# Patient Record
Sex: Female | Born: 1955 | Race: Black or African American | Hispanic: No | Smoking: Current every day smoker
Health system: Southern US, Community
[De-identification: ages and names within clinical notes are randomized; demographics above are authoritative.]

## PROBLEM LIST (undated history)

## (undated) DIAGNOSIS — M1990 Unspecified osteoarthritis, unspecified site: Secondary | ICD-10-CM

## (undated) DIAGNOSIS — E785 Hyperlipidemia, unspecified: Secondary | ICD-10-CM

## (undated) DIAGNOSIS — I10 Essential (primary) hypertension: Secondary | ICD-10-CM

## (undated) HISTORY — DX: Essential (primary) hypertension: I10

## (undated) HISTORY — PX: TUBAL LIGATION: SHX77

## (undated) HISTORY — PX: CHOLECYSTECTOMY: SHX55

## (undated) HISTORY — DX: Hyperlipidemia, unspecified: E78.5

## (undated) HISTORY — PX: HERNIA REPAIR: SHX51

---

## 2001-02-12 ENCOUNTER — Encounter: Payer: Self-pay | Admitting: Obstetrics and Gynecology

## 2001-02-12 ENCOUNTER — Ambulatory Visit (HOSPITAL_COMMUNITY): Admission: RE | Admit: 2001-02-12 | Discharge: 2001-02-12 | Payer: Self-pay | Admitting: Obstetrics and Gynecology

## 2001-04-21 ENCOUNTER — Other Ambulatory Visit: Admission: RE | Admit: 2001-04-21 | Discharge: 2001-04-21 | Payer: Self-pay | Admitting: Obstetrics and Gynecology

## 2001-11-19 ENCOUNTER — Emergency Department (HOSPITAL_COMMUNITY): Admission: EM | Admit: 2001-11-19 | Discharge: 2001-11-19 | Payer: Self-pay | Admitting: *Deleted

## 2002-02-16 ENCOUNTER — Ambulatory Visit (HOSPITAL_COMMUNITY): Admission: RE | Admit: 2002-02-16 | Discharge: 2002-02-16 | Payer: Self-pay | Admitting: Obstetrics and Gynecology

## 2002-02-16 ENCOUNTER — Encounter: Payer: Self-pay | Admitting: Obstetrics and Gynecology

## 2003-03-14 ENCOUNTER — Encounter: Payer: Self-pay | Admitting: Obstetrics and Gynecology

## 2003-03-14 ENCOUNTER — Ambulatory Visit (HOSPITAL_COMMUNITY): Admission: RE | Admit: 2003-03-14 | Discharge: 2003-03-14 | Payer: Self-pay | Admitting: Obstetrics and Gynecology

## 2003-11-06 ENCOUNTER — Ambulatory Visit (HOSPITAL_COMMUNITY): Admission: RE | Admit: 2003-11-06 | Discharge: 2003-11-06 | Payer: Self-pay | Admitting: Family Medicine

## 2004-04-17 ENCOUNTER — Ambulatory Visit (HOSPITAL_COMMUNITY): Admission: RE | Admit: 2004-04-17 | Discharge: 2004-04-17 | Payer: Self-pay | Admitting: Obstetrics and Gynecology

## 2005-05-08 ENCOUNTER — Ambulatory Visit (HOSPITAL_COMMUNITY): Admission: RE | Admit: 2005-05-08 | Discharge: 2005-05-08 | Payer: Self-pay | Admitting: Obstetrics and Gynecology

## 2006-05-18 ENCOUNTER — Ambulatory Visit (HOSPITAL_COMMUNITY): Admission: RE | Admit: 2006-05-18 | Discharge: 2006-05-18 | Payer: Self-pay | Admitting: Obstetrics & Gynecology

## 2006-09-10 ENCOUNTER — Ambulatory Visit (HOSPITAL_COMMUNITY): Admission: RE | Admit: 2006-09-10 | Discharge: 2006-09-10 | Payer: Self-pay | Admitting: Family Medicine

## 2007-05-20 ENCOUNTER — Ambulatory Visit (HOSPITAL_COMMUNITY): Admission: RE | Admit: 2007-05-20 | Discharge: 2007-05-20 | Payer: Self-pay | Admitting: Obstetrics & Gynecology

## 2007-05-24 ENCOUNTER — Other Ambulatory Visit: Admission: RE | Admit: 2007-05-24 | Discharge: 2007-05-24 | Payer: Self-pay | Admitting: Obstetrics & Gynecology

## 2008-02-26 ENCOUNTER — Ambulatory Visit (HOSPITAL_COMMUNITY): Admission: RE | Admit: 2008-02-26 | Discharge: 2008-02-26 | Payer: Self-pay | Admitting: Cardiology

## 2008-02-28 ENCOUNTER — Observation Stay (HOSPITAL_COMMUNITY): Admission: EM | Admit: 2008-02-28 | Discharge: 2008-02-29 | Payer: Self-pay | Admitting: Emergency Medicine

## 2008-02-28 ENCOUNTER — Encounter (INDEPENDENT_AMBULATORY_CARE_PROVIDER_SITE_OTHER): Payer: Self-pay | Admitting: General Surgery

## 2008-04-21 ENCOUNTER — Ambulatory Visit (HOSPITAL_COMMUNITY): Admission: RE | Admit: 2008-04-21 | Discharge: 2008-04-21 | Payer: Self-pay | Admitting: General Surgery

## 2008-05-26 ENCOUNTER — Other Ambulatory Visit: Admission: RE | Admit: 2008-05-26 | Discharge: 2008-05-26 | Payer: Self-pay | Admitting: Obstetrics and Gynecology

## 2008-05-26 ENCOUNTER — Ambulatory Visit (HOSPITAL_COMMUNITY): Admission: RE | Admit: 2008-05-26 | Discharge: 2008-05-26 | Payer: Self-pay | Admitting: Obstetrics & Gynecology

## 2009-06-25 ENCOUNTER — Ambulatory Visit (HOSPITAL_COMMUNITY): Admission: RE | Admit: 2009-06-25 | Discharge: 2009-06-25 | Payer: Self-pay | Admitting: Obstetrics & Gynecology

## 2009-08-07 ENCOUNTER — Other Ambulatory Visit: Admission: RE | Admit: 2009-08-07 | Discharge: 2009-08-07 | Payer: Self-pay | Admitting: Obstetrics & Gynecology

## 2010-11-19 NOTE — H&P (Signed)
[name], [name]              ACCOUNT NO.:  [id number]   MEDICAL RECORD NO.:  [id number]          PATIENT TYPE:  AMB   LOCATION:  DAY                           FACILITY:  [facility]   PHYSICIAN:  Dalia Heading, M.D.  DATE OF BIRTH:  12-18-1955   DATE OF ADMISSION:  DATE OF DISCHARGE:  LH                              HISTORY & PHYSICAL   CHIEF COMPLAINT:  Need for screening colonoscopy.   HISTORY OF PRESENT ILLNESS:  The patient is a 55 year old black female  who is referred for endoscopic evaluation.  She needs colonoscopy for  screening purposes.  No abdominal pain, weight loss, nausea, vomiting,  diarrhea, constipation, melena, or hematochezia had been noted.  She has  never had a colonoscopy.  There is no family history of colon carcinoma.   PAST MEDICAL HISTORY:  Hypertension, hypercholesterolemia.   PAST SURGICAL HISTORY:  Laparoscopic cholecystectomy in August 2009,  tubal ligation.   CURRENT MEDICATIONS:  A blood pressure pill.   ALLERGIES:  No known drug allergies.   REVIEW OF SYSTEMS:  Noncontributory.   PHYSICAL EXAMINATION:  GENERAL:  The patient is a well-developed, well-  nourished black female in no acute distress.  LUNGS:  Clear to auscultation with equal breath sounds bilaterally.  HEART:  Regular rate and rhythm without S3, S4, or murmurs.  ABDOMEN:  Soft, nontender, nondistended.  No hepatosplenomegaly or  masses are noted.  RECTAL:  Deferred to the procedure.   IMPRESSION:  Need for screening colonoscopy.   PLAN:  The patient is scheduled for a colonoscopy on April 21, 2008.  The risks and benefits of the procedure including bleeding and  perforation were fully explained to the patient, gave informed consent.      Dalia Heading, M.D.  Electronically Signed     MAJ/MEDQ  D:  04/04/2008  T:  04/05/2008  Job:  [id number]   cc:   Patrica Duel, M.D.  Fax: [phone]

## 2010-11-19 NOTE — Op Note (Signed)
[name], ABBASI              ACCOUNT NO.:  [id number]   MEDICAL RECORD NO.:  [id number]          PATIENT TYPE:  OBV   LOCATION:  A325                          FACILITY:  [facility]   PHYSICIAN:  Tilford Pillar, MD      DATE OF BIRTH:  [birth year]   DATE OF PROCEDURE:  02/28/2008  DATE OF DISCHARGE:  02/29/2008                               OPERATIVE REPORT   PREOPERATIVE DIAGNOSIS:  Cholelithiasis.   POSTOPERATIVE DIAGNOSIS:  Cholelithiasis.   PROCEDURES:  Laparoscopic cholecystectomy and enterolysis.   SURGEON:  Tilford Pillar, MD   ANESTHESIA:  General endotracheal, local anesthetic, 0.5% Sensorcaine  plain.   SPECIMEN:  Gallbladder.   ESTIMATED BLOOD LOSS:  Minimal   INDICATIONS:  The patient presented to [facility] with acute  right upper quadrant abdominal pain.  She had had similar episodes in  the past.  She was diagnosed with acute cholelithiasis.  The risks,  benefits, and alternatives of a laparoscopic possible open  cholecystectomy were discussed at length with the patient including, but  not limited to risk of bleeding, infection, bile leak, small bowel  injury, common bile duct injury, as well as the possibility of  intraoperative cardiac and pulmonary events.  The patient's questions  and concerns were addressed, and the patient was consented for the  planned procedure.   OPERATION:  The patient was taken to the operating room, was placed in a  supine position on the operating room table at which time the general  anesthetic was administered.  Once the patient was asleep, she was  endotracheally intubated by anesthesia.  At this point, her abdomen was  prepped with DuraPrep solution and draped in a sterile standard fashion.  A stab incision was created supraumbilically with an 11-blade scalpel.  Additional dissection down to the subcuticular tissue was carried out  using a Kocher clamp, which was utilized to grasp the anterior abdominal  wall fascia and  lifted anteriorly.  At this point, a Veress needle was  inserted.  Saline drop test was utilized to confirm intraperitoneal  placement and then pneumoperitoneum was initiated.  Once sufficient  pneumoperitoneum was obtained, an 11-mm trocar was inserted over a  laparoscope allowing visualization of the trocar entering into the  peritoneal cavity.  At this time, the inner cannula was removed.  The  laparoscope was reinserted.  There was no evidence of any trocar or  Veress needle placement injury.  At this time, the patient was placed in  a reverse Trendelenburg left lateral decubitus position and the  remaining trocars were placed in similar fashion with a stab incision  and placement of trocar under visualization.  An 11-mm trocar was placed  in the epigastrium, a 5-mm trocar was placed in the midline between the  two 11-mm trocars and a 5-mm trocar was placed in the right lateral  abdominal wall.  At this point, a grasper was utilized to grasp the  fundus of the gallbladder and lifted up and over the right lobe of the  liver.  There were several adhesions from the anterior abdominal wall to  the  liver capsule over the right lobe of the liver, these were taken  down using electrocautery to ensure that the elevation of the fundus of  the gallbladder did not cause the capsular tear.  At this point, the  peritoneum was bluntly stripped off of the infundibulum of the  gallbladder.  The cystic duct was identified.  A window was created  behind the cystic duct.  Three EndoClips were placed proximally, one  distally, and the cystic duct was divided between two most distal clips.  Similarly, the cystic artery was identified.  Two EndoClips placed  proximally, one distally, and cystic artery was divided between two more  cystic clips.  At this time, the electrocautery was utilized to dissect  the gallbladder free from the gallbladder fossa once the hemostasis was  obtained in the gallbladder fossa  using the electrocautery and at this  point once the gallbladder was freed, it was placed in an EndoCatch bag,  which was placed up and over the right lobe of the liver.  At this time,  attention was turned to closure.   A piece of Surgicel was placed in the gallbladder.  An Endoclose suture  passing device was utilized to pass a 2-0 Vicryl suture through both the  11-mm trocar sites.  With these sutures in place, the gallbladder was  removed in an intact EndoCatch bag to the epigastric trocar site.  Some  blunt dilatation was required to enlarge the trocar site adequately to  remove the gallbladder.  Once it was free, it was placed in the back  table and was sent as a permanent specimen to pathology.  The Vicryl  sutures were secured.  Local anesthetic was instilled.  A 4-0 Monocryl  was utilized to reapproximate the skin edges in a running subcuticular  suture.  The skin was washed and dried with moist dry towel.  Benzoin  was applied around incision.  Half-inch Steri-Strips were placed.  The  patient was allowed to come out of general anesthetic and was  transferred back to regular hospital bed and was transferred to  postanesthetic care unit in a stable condition.  At the conclusion of  procedure, all instrument, sponge, and needle counts were correct.  The  patient tolerated the procedure well.  l      Tilford Pillar, MD  Electronically Signed     BZ/MEDQ  D:  04/17/2008  T:  04/18/2008  Job:  [id number]   cc:   Dr. Phillips Odor

## 2010-11-19 NOTE — H&P (Signed)
[name], [name]              ACCOUNT NO.:  [id number]   MEDICAL RECORD NO.:  [id number]          PATIENT TYPE:  OBV   LOCATION:  A325                          FACILITY:  [facility]   PHYSICIAN:  Tilford Pillar, MD      DATE OF BIRTH:  1956/02/23   DATE OF ADMISSION:  02/28/2008  DATE OF DISCHARGE:  LH                              HISTORY & PHYSICAL   CHIEF COMPLAINT:  Abdominal pain in the right upper quadrant.   HISTORY OF PRESENT ILLNESS:  The patient is a 55 year old African  American female, who presented with a history of hypertension and  hypercholesterolemia, who presents with approximately 3 days of constant  and colicky right upper abdominal pain.  She has had similar  symptomatology in the past but never this severe and never lasted this  long.  She denies any radiation of symptomatologies.  She does state  that it gets worse with fatty greasy foods.  She has had no nausea and  vomiting.  No change in bowel movements.  No hematochezia.  No melena  and no acholic stools.  No history of jaundice.  No fevers and chills.  No relieving factors that she is aware of.  Her last meal that she ate  was last evening with some soup.  She does have a positive family  history for gallbladder disease.   PAST MEDICAL HISTORY:  1. Hypertension.  2. Hypercholesterolemia.   PAST SURGICAL HISTORY:  History of tubal ligation.   MEDICATION:  She is currently on antihypertensive medication, which she  cannot remember the name and a diuretic medication.   ALLERGIES:  No known drug allergies.   SOCIAL HISTORY:  She is a 1-pack-per day smoker.  Occasionally uses  alcohol.  No recreational drug use.   Review of systems is essentially unremarkable other than for above-  mentioned HPI.   PHYSICAL EXAMINATION:  VITAL SIGNS:  Afebrile.  Vital signs are stable  and nontachycardiac.  Blood pressure is normotensive and 98% oxygen  saturation on room air.  GENERAL:  The patient is a mildly obese  female.  She does not appear to  be in any acute distress.  She is alert and oriented x3.  HEENT:  Pupils are equal, round, and reactive.  Extraocular movements  are intact.  No conjunctival pallor or scleral icterus is noted.  Oral  mucosa is pink and moist.  Normal occlusion.  Trachea is midline.  NECK:  No cervical lymphadenopathy.  PULMONARY:  Unlabored respiration.  She is clear to auscultation  bilaterally.  CARDIOVASCULAR:  Regular rate and rhythm.  No murmurs or gallops are  apparent.  She has 2+ radial and dorsalis pedis pulses bilaterally.  No  peripheral edema is noted.  ABDOMEN:  Abdomen is obese and positive bowel sounds.  Abdomen is soft.  She does have a right upper quadrant pain, but no diffuse peritoneal  signs.  No rebound, no guarding, no masses, and no hernias.  EXTREMITIES:  Warm and dry.   PERTINENT LABORATORY AND RADIOGRAPHIC STUDIES:  CBC:  White blood cell  count 9.3, hemoglobin 14.2, hematocrit  43.3, and platelets of 296.  Basic Metabolic Panel:  Sodium 136, potassium 3.5, chloride 105, bicarb  25, BUN 9, creatinine 0.94, blood glucose 92, and total bili was within  normal limits.  AST and ALT were slightly elevated at 96 and 152  respectively.  Right upper quadrant ultrasound obtained on February 26, 2008, demonstrated positive gallbladder wall thickening and positive  gallstones consistent with acute cholecystitis.   ASSESSMENT/PLAN:  Acute cholelithiasis.  At this point, risks, benefits,  and alternative of laparoscopic possible open cholecystectomy were  discussed at length with the patient including but not limited to risk  of bleeding, infection, bile leak, small bowel injury, common bile duct  injury, as well as a possibility of intraoperative cardiac or pulmonary  events.  At this point, the patient will be kept in n.p.o. status, will  be given IV fluid hydration, and will be given intraoperative IV  antibiotic coverage.  A stat EKG and chest x-ray  will be obtained prior  to proceeding to the operating room for a urgent cholecystectomy.      Tilford Pillar, MD  Electronically Signed     BZ/MEDQ  D:  02/28/2008  T:  02/29/2008  Job:  [id number]   cc:   Patrica Duel, M.D.  Fax: [phone]

## 2012-08-20 ENCOUNTER — Other Ambulatory Visit (HOSPITAL_COMMUNITY): Payer: Self-pay | Admitting: Obstetrics and Gynecology

## 2012-08-20 DIAGNOSIS — Z139 Encounter for screening, unspecified: Secondary | ICD-10-CM

## 2012-08-27 ENCOUNTER — Ambulatory Visit (HOSPITAL_COMMUNITY): Payer: Self-pay

## 2012-09-03 ENCOUNTER — Ambulatory Visit (HOSPITAL_COMMUNITY)
Admission: RE | Admit: 2012-09-03 | Discharge: 2012-09-03 | Disposition: A | Discharge status: Home / Self Care | Payer: BC Managed Care – PPO | Source: Ambulatory Visit | Attending: Obstetrics and Gynecology | Admitting: Obstetrics and Gynecology

## 2012-09-03 DIAGNOSIS — Z139 Encounter for screening, unspecified: Secondary | ICD-10-CM

## 2012-09-03 DIAGNOSIS — Z1231 Encounter for screening mammogram for malignant neoplasm of breast: Secondary | ICD-10-CM | POA: Insufficient documentation

## 2012-09-06 ENCOUNTER — Other Ambulatory Visit: Payer: Self-pay | Admitting: Obstetrics and Gynecology

## 2012-10-13 ENCOUNTER — Encounter (HOSPITAL_COMMUNITY): Payer: BC Managed Care – PPO

## 2012-10-20 ENCOUNTER — Other Ambulatory Visit: Payer: Self-pay | Admitting: Obstetrics and Gynecology

## 2012-10-20 ENCOUNTER — Ambulatory Visit (HOSPITAL_COMMUNITY)
Admission: RE | Admit: 2012-10-20 | Discharge: 2012-10-20 | Disposition: A | Discharge status: Home / Self Care | Payer: BC Managed Care – PPO | Source: Ambulatory Visit | Attending: Obstetrics and Gynecology | Admitting: Obstetrics and Gynecology

## 2012-10-20 DIAGNOSIS — R928 Other abnormal and inconclusive findings on diagnostic imaging of breast: Secondary | ICD-10-CM

## 2012-10-26 ENCOUNTER — Ambulatory Visit (INDEPENDENT_AMBULATORY_CARE_PROVIDER_SITE_OTHER): Payer: Self-pay | Admitting: General Surgery

## 2013-05-31 ENCOUNTER — Telehealth (INDEPENDENT_AMBULATORY_CARE_PROVIDER_SITE_OTHER): Payer: Self-pay | Admitting: *Deleted

## 2013-05-31 ENCOUNTER — Ambulatory Visit (INDEPENDENT_AMBULATORY_CARE_PROVIDER_SITE_OTHER): Payer: BC Managed Care – PPO | Admitting: General Surgery

## 2013-05-31 ENCOUNTER — Encounter (INDEPENDENT_AMBULATORY_CARE_PROVIDER_SITE_OTHER): Payer: Self-pay | Admitting: General Surgery

## 2013-05-31 ENCOUNTER — Encounter (INDEPENDENT_AMBULATORY_CARE_PROVIDER_SITE_OTHER): Payer: Self-pay

## 2013-05-31 VITALS — BP 152/80 | HR 84 | Temp 98.1°F | Resp 16 | Ht 66.5 in | Wt 278.2 lb

## 2013-05-31 DIAGNOSIS — K439 Ventral hernia without obstruction or gangrene: Secondary | ICD-10-CM | POA: Insufficient documentation

## 2013-05-31 NOTE — Telephone Encounter (Signed)
I called pt and informed her of an appt for her CT scan at [facility] on 11/26 with an arrival time of 2:15pm.  Instructed her to drink 1st bottle of contrast at 12:30pm and 2nd bottle at 1:30pm.  Also informed her not to have any solid food 4 hours prior to scan.  Pt is agreeable with this plan.

## 2013-06-01 ENCOUNTER — Ambulatory Visit
Admission: RE | Admit: 2013-06-01 | Discharge: 2013-06-01 | Disposition: A | Discharge status: Home / Self Care | Payer: BC Managed Care – PPO | Source: Ambulatory Visit | Attending: General Surgery | Admitting: General Surgery

## 2013-06-01 DIAGNOSIS — K439 Ventral hernia without obstruction or gangrene: Secondary | ICD-10-CM

## 2013-06-01 MED ORDER — IOHEXOL 300 MG/ML  SOLN
125.0000 mL | Freq: Once | INTRAMUSCULAR | Status: AC | PRN
  Administered 2013-06-01: 125 mL via INTRAVENOUS

## 2013-06-01 NOTE — Progress Notes (Signed)
Patient ID: [name], female   DOB: [birth year], 56 y.o.   MRN: [id number]  Chief Complaint  Patient presents with  . New Evaluation    eval abd hernia    HPI [name] is a 57 y.o. female.  We are asked to see the patient in consultation by Dr. Phillips Odor to evaluate her for a ventral hernia. The patient is a 57 year old black female who has been experiencing crampy epigastric abdominal pain for the last year. She notices it mostly when she coughs. She denies any nausea or vomiting. Her appetite has been good about our working normally. She does have a history of a laparoscopic cholecystectomy and there maybe a 5 mm port Site overlying this area.  HPI  Past Medical History  Diagnosis Date  . Hyperlipidemia   . Hypertension     Past Surgical History  Procedure Laterality Date  . Cholecystectomy    . Tubal ligation      History reviewed. No pertinent family history.  Social History History  Substance Use Topics  . Smoking status: Current Every Day Smoker -- 1.00 packs/day    Types: Cigarettes  . Smokeless tobacco: Never Used  . Alcohol Use: Yes    No Known Allergies  Current Outpatient Prescriptions  Medication Sig Dispense Refill  . amLODipine-benazepril (LOTREL) 10-40 MG per capsule Take 1 capsule by mouth daily.      Marland Kitchen losartan-hydrochlorothiazide (HYZAAR) 100-25 MG per tablet Take 1 tablet by mouth daily.      . meloxicam (MOBIC) 15 MG tablet Take 15 mg by mouth daily.       No current facility-administered medications for this visit.    Review of Systems Review of Systems  Constitutional: Negative.   HENT: Negative.   Eyes: Negative.   Respiratory: Negative.   Cardiovascular: Negative.   Gastrointestinal: Negative.   Endocrine: Negative.   Genitourinary: Negative.   Musculoskeletal: Negative.   Skin: Negative.   Allergic/Immunologic: Negative.   Neurological: Negative.   Hematological: Negative.   Psychiatric/Behavioral: Negative.     Blood  pressure 152/80, pulse 84, temperature 98.1 F (36.7 C), temperature source Temporal, resp. rate 16, height 5' 6.5" (1.689 m), weight 278 lb 3.2 oz (126.191 kg).  Physical Exam Physical Exam  Constitutional: She is oriented to person, place, and time. She appears well-developed and well-nourished.  HENT:  Head: Normocephalic and atraumatic.  Eyes: Conjunctivae and EOM are normal. Pupils are equal, round, and reactive to light.  Neck: Normal range of motion. Neck supple.  Cardiovascular: Normal rate, regular rhythm and normal heart sounds.   Pulmonary/Chest: Effort normal and breath sounds normal.  Abdominal: Soft. Bowel sounds are normal.  There does appear to be some fullness midway between the xiphoid and umbilicus. This fullness does not seem to reduce. There is no abdominal distention. There is no tenderness.  Musculoskeletal: Normal range of motion.  Neurological: She is alert and oriented to person, place, and time.  Skin: Skin is warm and dry.  Psychiatric: She has a normal mood and affect. Her behavior is normal.    Data Reviewed As above  Assessment    The patient may have a hernia along her upper midline abdominal wall although with her size it is difficult to palpate completely. At this point I would recommend getting a CT scan of her abdomen and pelvis to confirm or rule out that she has a ventral hernia. This will also help for surgical planning she does have a hernia.  Plan    Plan for CT scan of the abdomen and pelvis. We will plan to see her back in the next week or so to go over the results of the study        TOTH III,PAUL S 06/01/2013, 1:15 PM

## 2013-06-07 ENCOUNTER — Telehealth (INDEPENDENT_AMBULATORY_CARE_PROVIDER_SITE_OTHER): Payer: Self-pay

## 2013-06-07 NOTE — Telephone Encounter (Signed)
Pt called for message and given the one left in Epic concerning her CT results.

## 2013-06-07 NOTE — Telephone Encounter (Signed)
Message copied by Brennan Bailey on Tue Jun 07, 2013  2:43 PM ------      Message from: Caleen Essex III      Created: Thu Jun 02, 2013 12:38 PM       She will need ventral hernia repair. I will talk to her about it when she comes back ------

## 2013-06-07 NOTE — Telephone Encounter (Signed)
LMOM> Ct shows ventral hernia. Will discuss surgical repair at follow up visit.

## 2013-06-16 ENCOUNTER — Ambulatory Visit (INDEPENDENT_AMBULATORY_CARE_PROVIDER_SITE_OTHER): Payer: BC Managed Care – PPO | Admitting: General Surgery

## 2013-06-16 ENCOUNTER — Encounter (HOSPITAL_COMMUNITY): Payer: Self-pay | Admitting: Pharmacy Technician

## 2013-06-16 ENCOUNTER — Encounter (INDEPENDENT_AMBULATORY_CARE_PROVIDER_SITE_OTHER): Payer: Self-pay | Admitting: General Surgery

## 2013-06-16 VITALS — BP 141/85 | HR 66 | Temp 97.7°F | Resp 18 | Ht 66.0 in | Wt 284.0 lb

## 2013-06-16 DIAGNOSIS — K439 Ventral hernia without obstruction or gangrene: Secondary | ICD-10-CM

## 2013-06-16 NOTE — Progress Notes (Signed)
Patient ID: [name], female   DOB: 12/23/1955, 57 y.o.   MRN: [id number]  Chief Complaint  Patient presents with  . Follow-up    talk about having surgery    HPI [name] is a 57 y.o. female.  The patient is a 57 -year-old black female who was so recently with some bulging of her upper abdominal wall especially when coughing. She underwent a CT scan which did show a small ventral supraumbilical hernia as well as a small umbilical hernia. There was no visceral contents within the hernia.  HPI  Past Medical History  Diagnosis Date  . Hyperlipidemia   . Hypertension     Past Surgical History  Procedure Laterality Date  . Cholecystectomy    . Tubal ligation      History reviewed. No pertinent family history.  Social History History  Substance Use Topics  . Smoking status: Current Every Day Smoker -- 1.00 packs/day    Types: Cigarettes  . Smokeless tobacco: Never Used  . Alcohol Use: Yes    No Known Allergies  Current Outpatient Prescriptions  Medication Sig Dispense Refill  . amLODipine-benazepril (LOTREL) 10-40 MG per capsule Take 1 capsule by mouth daily.      . losartan-hydrochlorothiazide (HYZAAR) 100-25 MG per tablet Take 1 tablet by mouth daily.      . meloxicam (MOBIC) 15 MG tablet Take 15 mg by mouth daily.       No current facility-administered medications for this visit.    Review of Systems Review of Systems  Constitutional: Negative.   HENT: Negative.   Eyes: Negative.   Respiratory: Negative.   Cardiovascular: Negative.   Gastrointestinal: Negative.   Endocrine: Negative.   Genitourinary: Negative.   Musculoskeletal: Negative.   Skin: Negative.   Allergic/Immunologic: Negative.   Neurological: Negative.   Hematological: Negative.   Psychiatric/Behavioral: Negative.     Blood pressure 141/85, pulse 66, temperature 97.7 F (36.5 C), temperature source Temporal, resp. rate 18, height 5' 6" (1.676 m), weight 284 lb (128.822  kg).  Physical Exam Physical Exam  Constitutional: She is oriented to person, place, and time. She appears well-developed and well-nourished.  HENT:  Head: Normocephalic and atraumatic.  Eyes: Conjunctivae and EOM are normal. Pupils are equal, round, and reactive to light.  Neck: Normal range of motion. Neck supple.  Cardiovascular: Normal rate, regular rhythm and normal heart sounds.   Pulmonary/Chest: Effort normal and breath sounds normal.  Abdominal: Soft. Bowel sounds are normal.  There is some slight fullness to the abdominal wall just above the umbilicus.  Musculoskeletal: Normal range of motion.  Lymphadenopathy:    She has no cervical adenopathy.  Neurological: She is alert and oriented to person, place, and time.  Skin: Skin is warm and dry.  Psychiatric: She has a normal mood and affect. Her behavior is normal.    Data Reviewed As above  Assessment    The patient has 2 small ventral hernias that are symptomatic. Because of the risk of incarceration And strangulation and the risk of these hernias getting larger I think she would benefit from having these hernias fixed. She would also like to have this done. I have discussed with her in detail the risks and benefits of the operation to fix the hernias as well as some of the technical aspects and she understands and wishes to proceed    Plan    Plan for laparoscopic ventral hernia repair with mesh          TOTH III,PAUL S 06/16/2013, 10:00 AM    

## 2013-06-16 NOTE — Patient Instructions (Signed)
Plan for laparoscopic ventral hernia repair with mesh 

## 2013-06-21 NOTE — Pre-Procedure Instructions (Signed)
Chrissi E Mclester  06/21/2013   Your procedure is scheduled on: Monday, Dec. 22th               (Free Valet Parking is available)  Report to [facility] (Entrance A) at 10:30 AM.   Call this number if you have problems the morning of surgery: [phone]   Remember:   Do not eat food or drink liquids after midnight Sunday.   Take these medicines the morning of surgery with A SIP OF WATER:  None   Do not wear jewelry, make-up or nail polish.  Do not wear lotions, powders, or perfumes. You may NOT wear deodorant.  Do not shave underarms & legs 48 hours prior to surgery.    Do not bring valuables to the hospital.  [facility] is not responsible for any belongings or valuables.               Contacts, dentures or bridgework may not be worn into surgery.  Leave suitcase in the car. After surgery it may be brought to your room.  For patients admitted to the hospital, discharge time is determined by your treatment team.               Patients discharged the day of surgery will not be allowed to drive home, and will need a responsible person to stay with you for 24 hrs afterwards.   Name and phone number of your driver:    Special Instructions: Shower using CHG 2 nights before surgery and the night before surgery.  If you shower the day of surgery use CHG.  Use special wash - you have one bottle of CHG for all showers.  You should use approximately 1/3 of the bottle for each shower.   Please read over the following fact sheets that you were given: Pain Booklet and Surgical Site Infection Prevention

## 2013-06-22 ENCOUNTER — Encounter (HOSPITAL_COMMUNITY)
Admission: RE | Admit: 2013-06-22 | Discharge: 2013-06-22 | Disposition: A | Discharge status: Home / Self Care | Payer: BC Managed Care – PPO | Source: Ambulatory Visit | Attending: General Surgery | Admitting: General Surgery

## 2013-06-22 ENCOUNTER — Encounter (HOSPITAL_COMMUNITY): Payer: Self-pay

## 2013-06-22 DIAGNOSIS — Z01810 Encounter for preprocedural cardiovascular examination: Secondary | ICD-10-CM | POA: Insufficient documentation

## 2013-06-22 DIAGNOSIS — Z01812 Encounter for preprocedural laboratory examination: Secondary | ICD-10-CM | POA: Insufficient documentation

## 2013-06-22 DIAGNOSIS — Z01818 Encounter for other preprocedural examination: Secondary | ICD-10-CM | POA: Insufficient documentation

## 2013-06-22 HISTORY — DX: Unspecified osteoarthritis, unspecified site: M19.90

## 2013-06-22 LAB — BASIC METABOLIC PANEL
BUN: 21 mg/dL (ref 6–23)
Calcium: 9.6 mg/dL (ref 8.4–10.5)
Creatinine, Ser: 1.1 mg/dL (ref 0.50–1.10)
GFR calc non Af Amer: 55 mL/min — ABNORMAL LOW (ref >90)
Glucose, Bld: 92 mg/dL (ref 70–99)
Potassium: 4 mEq/L (ref 3.5–5.1)
Sodium: 142 mEq/L (ref 135–145)

## 2013-06-22 LAB — CBC
Hemoglobin: 14.9 g/dL (ref 12.0–15.0)
MCH: 30.3 pg (ref 26.0–34.0)
MCHC: 34.5 g/dL (ref 30.0–36.0)
Platelets: 257 10*3/uL (ref 150–400)
RDW: 15.4 % (ref 11.5–15.5)
WBC: 10.7 10*3/uL — ABNORMAL HIGH (ref 4.0–10.5)

## 2013-06-22 MED ORDER — CHLORHEXIDINE GLUCONATE 4 % EX LIQD: 1.0000 "application " | Freq: Once | CUTANEOUS | Status: DC

## 2013-06-26 MED ORDER — DEXTROSE 5 % IV SOLN
3.0000 g | INTRAVENOUS | Status: AC
  Administered 2013-06-27: 3 g via INTRAVENOUS
  Filled 2013-06-26: qty 3000

## 2013-06-27 ENCOUNTER — Encounter (HOSPITAL_COMMUNITY)
Admission: RE | Disposition: A | Discharge status: Home / Self Care | Payer: Self-pay | Source: Ambulatory Visit | Attending: General Surgery

## 2013-06-27 ENCOUNTER — Encounter (HOSPITAL_COMMUNITY): Payer: BC Managed Care – PPO | Admitting: Anesthesiology

## 2013-06-27 ENCOUNTER — Encounter (HOSPITAL_COMMUNITY): Payer: Self-pay | Admitting: *Deleted

## 2013-06-27 ENCOUNTER — Ambulatory Visit (HOSPITAL_COMMUNITY)
Admission: RE | Admit: 2013-06-27 | Discharge: 2013-06-28 | Disposition: A | Discharge status: Home / Self Care | Payer: BC Managed Care – PPO | Source: Ambulatory Visit | Attending: General Surgery | Admitting: General Surgery

## 2013-06-27 ENCOUNTER — Ambulatory Visit (HOSPITAL_COMMUNITY): Payer: BC Managed Care – PPO | Admitting: Anesthesiology

## 2013-06-27 DIAGNOSIS — I10 Essential (primary) hypertension: Secondary | ICD-10-CM | POA: Diagnosis not present

## 2013-06-27 DIAGNOSIS — F17200 Nicotine dependence, unspecified, uncomplicated: Secondary | ICD-10-CM | POA: Diagnosis not present

## 2013-06-27 DIAGNOSIS — K429 Umbilical hernia without obstruction or gangrene: Secondary | ICD-10-CM | POA: Insufficient documentation

## 2013-06-27 DIAGNOSIS — K439 Ventral hernia without obstruction or gangrene: Secondary | ICD-10-CM | POA: Insufficient documentation

## 2013-06-27 DIAGNOSIS — E785 Hyperlipidemia, unspecified: Secondary | ICD-10-CM | POA: Diagnosis not present

## 2013-06-27 HISTORY — PX: INSERTION OF MESH: SHX5868

## 2013-06-27 HISTORY — PX: VENTRAL HERNIA REPAIR: SHX424

## 2013-06-27 SURGERY — REPAIR, HERNIA, VENTRAL, LAPAROSCOPIC
Anesthesia: General

## 2013-06-27 MED ORDER — EPHEDRINE SULFATE 50 MG/ML IJ SOLN
INTRAMUSCULAR | Status: DC | PRN
  Administered 2013-06-27: 5 mg via INTRAVENOUS

## 2013-06-27 MED ORDER — LACTATED RINGERS IV SOLN
INTRAVENOUS | Status: DC | PRN
  Administered 2013-06-27 (×2): via INTRAVENOUS

## 2013-06-27 MED ORDER — SUCCINYLCHOLINE CHLORIDE 20 MG/ML IJ SOLN
INTRAMUSCULAR | Status: DC | PRN
  Administered 2013-06-27: 100 mg via INTRAVENOUS

## 2013-06-27 MED ORDER — MORPHINE SULFATE 4 MG/ML IJ SOLN: 4.0000 mg | INTRAMUSCULAR | Status: DC | PRN

## 2013-06-27 MED ORDER — ROCURONIUM BROMIDE 100 MG/10ML IV SOLN
INTRAVENOUS | Status: DC | PRN
  Administered 2013-06-27: 50 mg via INTRAVENOUS
  Administered 2013-06-27: 10 mg via INTRAVENOUS

## 2013-06-27 MED ORDER — AMLODIPINE BESY-BENAZEPRIL HCL 10-40 MG PO CAPS: 1.0000 | ORAL_CAPSULE | Freq: Every day | ORAL | Status: DC

## 2013-06-27 MED ORDER — FENTANYL CITRATE 0.05 MG/ML IJ SOLN
INTRAMUSCULAR | Status: DC | PRN
  Administered 2013-06-27: 100 ug via INTRAVENOUS
  Administered 2013-06-27 (×3): 50 ug via INTRAVENOUS
  Administered 2013-06-27: 100 ug via INTRAVENOUS
  Administered 2013-06-27 (×3): 50 ug via INTRAVENOUS

## 2013-06-27 MED ORDER — HYDROCHLOROTHIAZIDE 25 MG PO TABS
25.0000 mg | ORAL_TABLET | Freq: Every day | ORAL | Status: DC
  Administered 2013-06-27: 25 mg via ORAL
  Filled 2013-06-27 (×2): qty 1

## 2013-06-27 MED ORDER — NEOSTIGMINE METHYLSULFATE 1 MG/ML IJ SOLN
INTRAMUSCULAR | Status: DC | PRN
  Administered 2013-06-27: 4 mg via INTRAVENOUS

## 2013-06-27 MED ORDER — HYDROMORPHONE HCL PF 1 MG/ML IJ SOLN
0.2500 mg | INTRAMUSCULAR | Status: DC | PRN
  Administered 2013-06-27 (×2): 0.5 mg via INTRAVENOUS

## 2013-06-27 MED ORDER — KETOROLAC TROMETHAMINE 30 MG/ML IJ SOLN
15.0000 mg | Freq: Once | INTRAMUSCULAR | Status: AC | PRN
  Administered 2013-06-27: 30 mg via INTRAVENOUS

## 2013-06-27 MED ORDER — LIDOCAINE HCL (CARDIAC) 20 MG/ML IV SOLN
INTRAVENOUS | Status: DC | PRN
  Administered 2013-06-27: 60 mg via INTRAVENOUS

## 2013-06-27 MED ORDER — BENAZEPRIL HCL 40 MG PO TABS
40.0000 mg | ORAL_TABLET | Freq: Every day | ORAL | Status: DC
  Administered 2013-06-27: 40 mg via ORAL
  Filled 2013-06-27 (×2): qty 1

## 2013-06-27 MED ORDER — 0.9 % SODIUM CHLORIDE (POUR BTL) OPTIME
TOPICAL | Status: DC | PRN
  Administered 2013-06-27: 1000 mL

## 2013-06-27 MED ORDER — OXYCODONE HCL 5 MG PO TABS
ORAL_TABLET | ORAL | Status: AC
  Filled 2013-06-27: qty 1

## 2013-06-27 MED ORDER — HYDROMORPHONE HCL PF 1 MG/ML IJ SOLN
INTRAMUSCULAR | Status: AC
  Filled 2013-06-27: qty 1

## 2013-06-27 MED ORDER — ONDANSETRON HCL 4 MG PO TABS: 4.0000 mg | ORAL_TABLET | Freq: Four times a day (QID) | ORAL | Status: DC | PRN

## 2013-06-27 MED ORDER — GLYCOPYRROLATE 0.2 MG/ML IJ SOLN
INTRAMUSCULAR | Status: DC | PRN
  Administered 2013-06-27: 0.6 mg via INTRAVENOUS

## 2013-06-27 MED ORDER — OXYCODONE HCL 5 MG/5ML PO SOLN: 5.0000 mg | Freq: Once | ORAL | Status: AC | PRN

## 2013-06-27 MED ORDER — BUPIVACAINE-EPINEPHRINE 0.25% -1:200000 IJ SOLN
INTRAMUSCULAR | Status: DC | PRN
  Administered 2013-06-27: 30 mL

## 2013-06-27 MED ORDER — ONDANSETRON HCL 4 MG/2ML IJ SOLN
INTRAMUSCULAR | Status: DC | PRN
  Administered 2013-06-27: 4 mg via INTRAVENOUS

## 2013-06-27 MED ORDER — AMLODIPINE BESYLATE 10 MG PO TABS
10.0000 mg | ORAL_TABLET | Freq: Every day | ORAL | Status: DC
  Administered 2013-06-27: 10 mg via ORAL
  Filled 2013-06-27 (×2): qty 1

## 2013-06-27 MED ORDER — KETOROLAC TROMETHAMINE 30 MG/ML IJ SOLN
INTRAMUSCULAR | Status: AC
  Filled 2013-06-27: qty 1

## 2013-06-27 MED ORDER — PHENYLEPHRINE HCL 10 MG/ML IJ SOLN
10.0000 mg | INTRAVENOUS | Status: DC | PRN
  Administered 2013-06-27: 25 ug/min via INTRAVENOUS

## 2013-06-27 MED ORDER — BUPIVACAINE-EPINEPHRINE PF 0.25-1:200000 % IJ SOLN
INTRAMUSCULAR | Status: AC
  Filled 2013-06-27: qty 30

## 2013-06-27 MED ORDER — OXYCODONE HCL 5 MG PO TABS
5.0000 mg | ORAL_TABLET | Freq: Once | ORAL | Status: AC | PRN
  Administered 2013-06-27: 5 mg via ORAL

## 2013-06-27 MED ORDER — MIDAZOLAM HCL 5 MG/5ML IJ SOLN
INTRAMUSCULAR | Status: DC | PRN
  Administered 2013-06-27: 2 mg via INTRAVENOUS

## 2013-06-27 MED ORDER — PROPOFOL 10 MG/ML IV BOLUS
INTRAVENOUS | Status: DC | PRN
  Administered 2013-06-27: 50 mg via INTRAVENOUS
  Administered 2013-06-27 (×2): 20 mg via INTRAVENOUS
  Administered 2013-06-27: 180 mg via INTRAVENOUS
  Administered 2013-06-27: 50 mg via INTRAVENOUS

## 2013-06-27 MED ORDER — ONDANSETRON HCL 4 MG/2ML IJ SOLN: 4.0000 mg | Freq: Once | INTRAMUSCULAR | Status: DC | PRN

## 2013-06-27 MED ORDER — OXYCODONE-ACETAMINOPHEN 5-325 MG PO TABS
1.0000 | ORAL_TABLET | ORAL | Status: DC | PRN
  Administered 2013-06-28 (×2): 1 via ORAL
  Filled 2013-06-27 (×2): qty 1

## 2013-06-27 MED ORDER — LOSARTAN POTASSIUM-HCTZ 100-25 MG PO TABS: 1.0000 | ORAL_TABLET | Freq: Every day | ORAL | Status: DC

## 2013-06-27 MED ORDER — LACTATED RINGERS IV SOLN
INTRAVENOUS | Status: DC
  Administered 2013-06-27: 11:00:00 via INTRAVENOUS

## 2013-06-27 MED ORDER — KCL IN DEXTROSE-NACL 20-5-0.9 MEQ/L-%-% IV SOLN
INTRAVENOUS | Status: DC
  Administered 2013-06-27 – 2013-06-28 (×2): via INTRAVENOUS
  Filled 2013-06-27 (×3): qty 1000

## 2013-06-27 MED ORDER — HYDROMORPHONE HCL PF 1 MG/ML IJ SOLN
INTRAMUSCULAR | Status: DC | PRN
Start: 2013-06-27 — End: 2013-06-27
  Administered 2013-06-27: 1 mg via INTRAVENOUS

## 2013-06-27 MED ORDER — ONDANSETRON HCL 4 MG/2ML IJ SOLN
4.0000 mg | Freq: Four times a day (QID) | INTRAMUSCULAR | Status: DC | PRN
  Administered 2013-06-27: 4 mg via INTRAVENOUS
  Filled 2013-06-27: qty 2

## 2013-06-27 MED ORDER — SODIUM CHLORIDE 0.9 % IR SOLN
Status: DC | PRN
  Administered 2013-06-27: 1000 mL

## 2013-06-27 SURGICAL SUPPLY — 57 items
ADH SKN CLS APL DERMABOND .7 (GAUZE/BANDAGES/DRESSINGS) ×4
APPLIER CLIP LOGIC TI 5 (MISCELLANEOUS) IMPLANT
APPLIER CLIP ROT 10 11.4 M/L (STAPLE)
APR CLP MED LRG 11.4X10 (STAPLE)
APR CLP MED LRG 33X5 (MISCELLANEOUS)
BANDAGE GAUZE ELAST BULKY 4 IN (GAUZE/BANDAGES/DRESSINGS) IMPLANT
BINDER ABD UNIV 12 30-45 (MISCELLANEOUS) IMPLANT
BINDER ABDOMINAL 12 (MISCELLANEOUS) ×2
CANISTER SUCTION 2500CC (MISCELLANEOUS) ×1 IMPLANT
CHLORAPREP W/TINT 26ML (MISCELLANEOUS) ×2 IMPLANT
CLIP APPLIE ROT 10 11.4 M/L (STAPLE) IMPLANT
COVER SURGICAL LIGHT HANDLE (MISCELLANEOUS) ×2 IMPLANT
DECANTER SPIKE VIAL GLASS SM (MISCELLANEOUS) ×2 IMPLANT
DERMABOND ADVANCED (GAUZE/BANDAGES/DRESSINGS) ×4
DERMABOND ADVANCED .7 DNX12 (GAUZE/BANDAGES/DRESSINGS) ×1 IMPLANT
DEVICE SECURE STRAP 25 ABSORB (INSTRUMENTS) ×2 IMPLANT
DEVICE TROCAR PUNCTURE CLOSURE (ENDOMECHANICALS) ×2 IMPLANT
DRAPE INCISE IOBAN 66X45 STRL (DRAPES) ×2 IMPLANT
DRAPE UTILITY 15X26 W/TAPE STR (DRAPE) ×4 IMPLANT
ELECT CAUTERY BLADE 6.4 (BLADE) ×2 IMPLANT
ELECT REM PT RETURN 9FT ADLT (ELECTROSURGICAL) ×2
ELECTRODE REM PT RTRN 9FT ADLT (ELECTROSURGICAL) ×1 IMPLANT
GLOVE BIO SURGEON STRL SZ7.5 (GLOVE) ×2 IMPLANT
GLOVE BIOGEL PI IND STRL 6.5 (GLOVE) IMPLANT
GLOVE BIOGEL PI IND STRL 7.0 (GLOVE) IMPLANT
GLOVE BIOGEL PI IND STRL 7.5 (GLOVE) IMPLANT
GLOVE BIOGEL PI INDICATOR 6.5 (GLOVE) ×2
GLOVE BIOGEL PI INDICATOR 7.0 (GLOVE) ×3
GLOVE BIOGEL PI INDICATOR 7.5 (GLOVE) ×1
GLOVE ECLIPSE 6.5 STRL STRAW (GLOVE) ×1 IMPLANT
GLOVE SURG SS PI 7.5 STRL IVOR (GLOVE) ×3 IMPLANT
GOWN STRL NON-REIN LRG LVL3 (GOWN DISPOSABLE) ×6 IMPLANT
GOWN STRL REIN XL XLG (GOWN DISPOSABLE) ×2 IMPLANT
KIT BASIN OR (CUSTOM PROCEDURE TRAY) ×2 IMPLANT
KIT ROOM TURNOVER OR (KITS) ×2 IMPLANT
MARKER SKIN DUAL TIP RULER LAB (MISCELLANEOUS) ×2 IMPLANT
MESH VENTRALIGHT ST 6X8 (Mesh Specialty) ×2 IMPLANT
MESH VENTRLGHT ELLIPSE 8X6XMFL (Mesh Specialty) IMPLANT
NDL SPNL 22GX3.5 QUINCKE BK (NEEDLE) ×1 IMPLANT
NEEDLE SPNL 22GX3.5 QUINCKE BK (NEEDLE) ×2 IMPLANT
NS IRRIG 1000ML POUR BTL (IV SOLUTION) ×2 IMPLANT
PAD ARMBOARD 7.5X6 YLW CONV (MISCELLANEOUS) ×2 IMPLANT
PENCIL BUTTON HOLSTER BLD 10FT (ELECTRODE) ×2 IMPLANT
SCALPEL HARMONIC ACE (MISCELLANEOUS) ×1 IMPLANT
SCISSORS LAP 5X35 DISP (ENDOMECHANICALS) ×1 IMPLANT
SET IRRIG TUBING LAPAROSCOPIC (IRRIGATION / IRRIGATOR) IMPLANT
SLEEVE ENDOPATH XCEL 5M (ENDOMECHANICALS) ×3 IMPLANT
SUT MNCRL AB 4-0 PS2 18 (SUTURE) ×2 IMPLANT
SUT NOVA NAB DX-16 0-1 5-0 T12 (SUTURE) ×3 IMPLANT
TOWEL OR 17X24 6PK STRL BLUE (TOWEL DISPOSABLE) ×2 IMPLANT
TOWEL OR 17X26 10 PK STRL BLUE (TOWEL DISPOSABLE) ×2 IMPLANT
TRAY FOLEY CATH 16FR SILVER (SET/KITS/TRAYS/PACK) ×2 IMPLANT
TRAY LAPAROSCOPIC (CUSTOM PROCEDURE TRAY) ×2 IMPLANT
TROCAR XCEL BLUNT TIP 100MML (ENDOMECHANICALS) IMPLANT
TROCAR XCEL NON-BLD 11X100MML (ENDOMECHANICALS) ×2 IMPLANT
TROCAR XCEL NON-BLD 5MMX100MML (ENDOMECHANICALS) ×2 IMPLANT
TUBING INSUFFLATION (TUBING) ×1 IMPLANT

## 2013-06-27 NOTE — H&P (View-Only) (Signed)
Patient ID: [name], female   DOB: 05/08/1956, 57 y.o.   MRN: [id number]  Chief Complaint  Patient presents with  . Follow-up    talk about having surgery    HPI [name] is a 57 y.o. female.  The patient is a 77 -year-old black female who was so recently with some bulging of her upper abdominal wall especially when coughing. She underwent a CT scan which did show a small ventral supraumbilical hernia as well as a small umbilical hernia. There was no visceral contents within the hernia.  HPI  Past Medical History  Diagnosis Date  . Hyperlipidemia   . Hypertension     Past Surgical History  Procedure Laterality Date  . Cholecystectomy    . Tubal ligation      History reviewed. No pertinent family history.  Social History History  Substance Use Topics  . Smoking status: Current Every Day Smoker -- 1.00 packs/day    Types: Cigarettes  . Smokeless tobacco: Never Used  . Alcohol Use: Yes    No Known Allergies  Current Outpatient Prescriptions  Medication Sig Dispense Refill  . amLODipine-benazepril (LOTREL) 10-40 MG per capsule Take 1 capsule by mouth daily.      Marland Kitchen losartan-hydrochlorothiazide (HYZAAR) 100-25 MG per tablet Take 1 tablet by mouth daily.      . meloxicam (MOBIC) 15 MG tablet Take 15 mg by mouth daily.       No current facility-administered medications for this visit.    Review of Systems Review of Systems  Constitutional: Negative.   HENT: Negative.   Eyes: Negative.   Respiratory: Negative.   Cardiovascular: Negative.   Gastrointestinal: Negative.   Endocrine: Negative.   Genitourinary: Negative.   Musculoskeletal: Negative.   Skin: Negative.   Allergic/Immunologic: Negative.   Neurological: Negative.   Hematological: Negative.   Psychiatric/Behavioral: Negative.     Blood pressure 141/85, pulse 66, temperature 97.7 F (36.5 C), temperature source Temporal, resp. rate 18, height 5\' 6"  (1.676 m), weight 284 lb (128.822  kg).  Physical Exam Physical Exam  Constitutional: She is oriented to person, place, and time. She appears well-developed and well-nourished.  HENT:  Head: Normocephalic and atraumatic.  Eyes: Conjunctivae and EOM are normal. Pupils are equal, round, and reactive to light.  Neck: Normal range of motion. Neck supple.  Cardiovascular: Normal rate, regular rhythm and normal heart sounds.   Pulmonary/Chest: Effort normal and breath sounds normal.  Abdominal: Soft. Bowel sounds are normal.  There is some slight fullness to the abdominal wall just above the umbilicus.  Musculoskeletal: Normal range of motion.  Lymphadenopathy:    She has no cervical adenopathy.  Neurological: She is alert and oriented to person, place, and time.  Skin: Skin is warm and dry.  Psychiatric: She has a normal mood and affect. Her behavior is normal.    Data Reviewed As above  Assessment    The patient has 2 small ventral hernias that are symptomatic. Because of the risk of incarceration And strangulation and the risk of these hernias getting larger I think she would benefit from having these hernias fixed. She would also like to have this done. I have discussed with her in detail the risks and benefits of the operation to fix the hernias as well as some of the technical aspects and she understands and wishes to proceed    Plan    Plan for laparoscopic ventral hernia repair with mesh  TOTH III,PAUL S 06/16/2013, 10:00 AM

## 2013-06-27 NOTE — Anesthesia Preprocedure Evaluation (Addendum)
Anesthesia Evaluation  Patient identified by MRN, date of birth, ID band Patient awake    Reviewed: Allergy & Precautions, H&P , NPO status , Patient's Chart, lab work & pertinent test results  Airway Mallampati: II TM Distance: >3 FB Neck ROM: Full    Dental  (+) Loose and Partial Upper,    Pulmonary Current Smoker,  breath sounds clear to auscultation        Cardiovascular hypertension, Rhythm:Regular Rate:Normal     Neuro/Psych    GI/Hepatic   Endo/Other    Renal/GU      Musculoskeletal   Abdominal (+) + obese,   Peds  Hematology   Anesthesia Other Findings   Reproductive/Obstetrics                          Anesthesia Physical Anesthesia Plan  ASA: III  Anesthesia Plan: General   Post-op Pain Management:    Induction: Intravenous  Airway Management Planned: Oral ETT  Additional Equipment:   Intra-op Plan:   Post-operative Plan: Extubation in OR  Informed Consent: I have reviewed the patients History and Physical, chart, labs and discussed the procedure including the risks, benefits and alternatives for the proposed anesthesia with the patient or authorized representative who has indicated his/her understanding and acceptance.   Dental advisory given  Plan Discussed with: CRNA and Anesthesiologist  Anesthesia Plan Comments: (Htn Smoker Obesity Loose L upper molar  Plan GA with oral ETT  Kipp Brood, MD)        Anesthesia Quick Evaluation

## 2013-06-27 NOTE — Interval H&P Note (Signed)
History and Physical Interval Note:  06/27/2013 12:15 PM  [name]  has presented today for surgery, with the diagnosis of Ventral hernia  The various methods of treatment have been discussed with the patient and family. After consideration of risks, benefits and other options for treatment, the patient has consented to  Procedure(s): LAPAROSCOPIC VENTRAL HERNIA (N/A) INSERTION OF MESH (N/A) as a surgical intervention .  The patient's history has been reviewed, patient examined, no change in status, stable for surgery.  I have reviewed the patient's chart and labs.  Questions were answered to the patient's satisfaction.     TOTH III,Orilla Templeman S

## 2013-06-27 NOTE — Op Note (Signed)
06/27/2013  2:42 PM  PATIENT:  [name]  57 y.o. female  PRE-OPERATIVE DIAGNOSIS:  Ventral hernia  POST-OPERATIVE DIAGNOSIS:  Ventral hernia  PROCEDURE:  Procedure(s): LAPAROSCOPIC VENTRAL HERNIA (N/A) INSERTION OF MESH (N/A)  SURGEON:  Surgeon(s) and Role:    * Robyne Askew, MD - Primary  PHYSICIAN ASSISTANT:   ASSISTANTS: none   ANESTHESIA:   general  EBL:  Total I/O In: 1000 [I.V.:1000] Out: 15 [Urine:15]  BLOOD ADMINISTERED:none  DRAINS: none   LOCAL MEDICATIONS USED:  MARCAINE     SPECIMEN:  No Specimen  DISPOSITION OF SPECIMEN:  N/A  COUNTS:  YES  TOURNIQUET:  * No tourniquets in log *  DICTATION: .Dragon Dictation After informed consent was obtained the patient was brought to the operating room and placed in the supine position on the operating room table. After adequate induction of general anesthesia the patient's abdomen was prepped with ChloraPrep, dry, and draped in usual sterile manner. A site was chosen in the left upper quadrant for accessing the abdominal cavity. This area was infiltrated with quarter percent Marcaine. A small stab incision was made with a 15 blade knife. A 5 mm Optiview port was used to bluntly dissect through all the layers of the abdominal wall under direct vision until we had entered the abdominal cavity. The abdomen was then insufflated with carbon dioxide without difficulty. A second 5 mm port was placed on the left mid abdomen under direct vision. This area was infiltrated she Marcaine and a small stab incision was made with a 15 blade knife. A 5 mm port was placed bluntly through this incision and the abdominal cavity under direct vision and was then inspected. A small ventral hernia was identified with some omentum adherent to the hernia sac. This was able to be bluntly reduced without difficulty. There was also a small umbilical hernia but there were no contents within that defect. The falciform ligament was taken down  sharply with the harmonic scalpel. The rest of the abdominal wall was free of any adhesion. A 15 x 20 cm piece of Ventralight mesh was chosen. 6 #1 Novafil stitches were placed at equidistant points around the edge of the mesh. Next a small incision was made overlying the hernia defect. This incision was carried through the skin and subcutaneous tissue sharply with electrocautery until the hernia was entered. The hernia sac was excised sharply with the harmonic scalpel and Bovie electrocautery. The mesh was placed through this opening into the abdominal cavity and then the fascial defect was closed with interrupted #1 Novafil stitches. The abdomen was then reinsufflated. 6 stab incisions were made at points that corresponded to the stitches around the edge of the mesh. A suture breast was used to bring the tails of the mesh Through the abdominal wall. The mesh was then held tightly in apposition to the abdominal wall wall each stitch was tied. Once this was accomplished the mesh was nicely opposing the anterior abdominal wall. The gaps between the stitches were filled in using a suture strap tacker. In order to do this a third 5 mm port was placed on the right mid abdomen. Once this was accomplished the mesh appeared to be in good apposition to the abdominal wall without any gaps or redundancy. The area was examined and found to be hemostatic. This was then allowed to escape and the ports were removed under direct vision without difficulty. All of the skin incisions were then closed with interrupted 4-0 Monocryl  subcuticular stitches. Dermabond dressings and abdominal binder were applied. The patient tolerated the procedure well. At the end of the case all needle sponge and instrument counts were correct. The patient was awakened and taken to recovery in stable condition.  PLAN OF CARE: Admit for overnight observation  PATIENT DISPOSITION:  PACU - hemodynamically stable.   Delay start of Pharmacological VTE  agent (>24hrs) due to surgical blood loss or risk of bleeding: yes

## 2013-06-27 NOTE — Anesthesia Procedure Notes (Signed)
Procedure Name: Intubation Date/Time: 06/27/2013 12:48 PM Performed by: Orvilla Fus A Pre-anesthesia Checklist: Patient identified, Timeout performed, Emergency Drugs available, Suction available and Patient being monitored Patient Re-evaluated:Patient Re-evaluated prior to inductionOxygen Delivery Method: Circle system utilized Preoxygenation: Pre-oxygenation with 100% oxygen Intubation Type: IV induction Ventilation: Mask ventilation without difficulty and Oral airway inserted - appropriate to patient size Laryngoscope Size: Mac and 3 Grade View: Grade I Tube type: Oral Tube size: 7.5 mm Number of attempts: 1 Airway Equipment and Method: Stylet Placement Confirmation: ETT inserted through vocal cords under direct vision,  breath sounds checked- equal and bilateral and positive ETCO2 Secured at: 20 cm Tube secured with: Tape Dental Injury: Teeth and Oropharynx as per pre-operative assessment

## 2013-06-27 NOTE — Transfer of Care (Signed)
Immediate Anesthesia Transfer of Care Note  Patient: [name]  Procedure(s) Performed: Procedure(s): LAPAROSCOPIC VENTRAL HERNIA (N/A) INSERTION OF MESH (N/A)  Patient Location: PACU  Anesthesia Type:General  Level of Consciousness: awake, alert  and oriented  Airway & Oxygen Therapy: Patient Spontanous Breathing and Patient connected to nasal cannula oxygen  Post-op Assessment: Report given to PACU RN, Post -op Vital signs reviewed and stable and Patient moving all extremities X 4  Post vital signs: Reviewed and stable  Complications: No apparent anesthesia complications

## 2013-06-27 NOTE — Anesthesia Postprocedure Evaluation (Signed)
  Anesthesia Post-op Note  Patient: [name]  Procedure(s) Performed: Procedure(s): LAPAROSCOPIC VENTRAL HERNIA (N/A) INSERTION OF MESH (N/A)  Patient Location: PACU  Anesthesia Type:General  Level of Consciousness: awake, alert  and oriented  Airway and Oxygen Therapy: Patient Spontanous Breathing and Patient connected to nasal cannula oxygen  Post-op Pain: mild  Post-op Assessment: Post-op Vital signs reviewed, Patient's Cardiovascular Status Stable, Respiratory Function Stable, Patent Airway and Pain level controlled  Post-op Vital Signs: stable  Complications: No apparent anesthesia complications

## 2013-06-28 ENCOUNTER — Encounter (HOSPITAL_COMMUNITY): Payer: Self-pay | Admitting: General Surgery

## 2013-06-28 DIAGNOSIS — K439 Ventral hernia without obstruction or gangrene: Secondary | ICD-10-CM | POA: Diagnosis not present

## 2013-06-28 NOTE — Progress Notes (Signed)
1 Day Post-Op  Subjective: No complaints  Objective: Vital signs in last 24 hours: Temp:  [96.9 F (36.1 C)-98.9 F (37.2 C)] 98.9 F (37.2 C) (12/23 0609) Pulse Rate:  [66-98] 77 (12/23 0609) Resp:  [15-20] 18 (12/23 0609) BP: (106-179)/(63-93) 106/63 mmHg (12/23 0609) SpO2:  [89 %-100 %] 97 % (12/23 0609) Weight:  [267 lb 3.2 oz (121.2 kg)] 267 lb 3.2 oz (121.2 kg) (12/22 1629) Last BM Date: 06/27/13  Intake/Output from previous day: 12/22 0701 - 12/23 0700 In: 1428.8 [I.V.:1428.8] Out: 1865 [Urine:1865] Intake/Output this shift:    GI: soft, appropriately tender. good bs  Lab Results:  No results found for this basename: WBC, HGB, HCT, PLT,  in the last 72 hours BMET No results found for this basename: NA, K, CL, CO2, GLUCOSE, BUN, CREATININE, CALCIUM,  in the last 72 hours PT/INR No results found for this basename: LABPROT, INR,  in the last 72 hours ABG No results found for this basename: PHART, PCO2, PO2, HCO3,  in the last 72 hours  Studies/Results: No results found.  Anti-infectives: Anti-infectives   Start     Dose/Rate Route Frequency Ordered Stop   06/27/13 0600  ceFAZolin (ANCEF) 3 g in dextrose 5 % 50 mL IVPB     3 g 160 mL/hr over 30 Minutes Intravenous On call to O.R. 06/26/13 1502 06/27/13 1242      Assessment/Plan: s/p Procedure(s): LAPAROSCOPIC VENTRAL HERNIA (N/A) INSERTION OF MESH (N/A) Advance diet Discharge  LOS: 1 day    TOTH III,PAUL S 06/28/2013

## 2013-06-28 NOTE — Discharge Summary (Signed)
Physician Discharge Summary  Patient ID: JAELYN BOURGOIN MRN: [id number] DOB/AGE: 08-16-1955 57 y.o.  Admit date: 06/27/2013 Discharge date: 06/28/2013  Admission Diagnoses:  Discharge Diagnoses:  Active Problems:   Ventral hernia   Discharged Condition: good  Hospital Course: the pt underwent lap ventral hernia repair with mesh. She tolerated surgery well. On pod 1 she was ready for discharge home  Consults: None  Significant Diagnostic Studies: none  Treatments: surgery: as above  Discharge Exam: Blood pressure 106/63, pulse 77, temperature 98.9 F (37.2 C), temperature source Oral, resp. rate 18, height 5\' 6"  (1.676 m), weight 267 lb 3.2 oz (121.2 kg), SpO2 97.00%. GI: soft, appropriately tender  Disposition:   Discharge Orders   Future Appointments Provider Department Dept Phone   07/19/2013 11:20 AM Robyne Askew, MD [facility], [place] [phone]   Future Orders Complete By Expires   Call MD for:  difficulty breathing, headache or visual disturbances  As directed    Call MD for:  extreme fatigue  As directed    Call MD for:  hives  As directed    Call MD for:  persistant dizziness or light-headedness  As directed    Call MD for:  persistant nausea and vomiting  As directed    Call MD for:  redness, tenderness, or signs of infection (pain, swelling, redness, odor or green/yellow discharge around incision site)  As directed    Call MD for:  severe uncontrolled pain  As directed    Call MD for:  temperature >100.4  As directed    Diet - low sodium heart healthy  As directed    Discharge instructions  As directed    Comments:     No heavy lifting. May shower. Use colace and miralax to avoid constipation   Increase activity slowly  As directed    No wound care  As directed        Medication List         amLODipine-benazepril 10-40 MG per capsule  Commonly known as:  LOTREL  Take 1 capsule by mouth daily.     losartan-hydrochlorothiazide 100-25  MG per tablet  Commonly known as:  HYZAAR  Take 1 tablet by mouth daily.     meloxicam 15 MG tablet  Commonly known as:  MOBIC  Take 15 mg by mouth daily.         Signed: Griselda Miner S 06/28/2013, 8:07 AM

## 2013-06-28 NOTE — Progress Notes (Signed)
Pt d/c with daughter, pain controlled, d/c instructions given ^& demonstrated understanding with teach back to nurse.

## 2013-07-19 ENCOUNTER — Encounter (INDEPENDENT_AMBULATORY_CARE_PROVIDER_SITE_OTHER): Payer: Self-pay | Admitting: General Surgery

## 2013-07-19 ENCOUNTER — Ambulatory Visit (INDEPENDENT_AMBULATORY_CARE_PROVIDER_SITE_OTHER): Payer: BC Managed Care – PPO | Admitting: General Surgery

## 2013-07-19 VITALS — BP 150/96 | Temp 98.6°F | Ht 66.0 in | Wt 283.6 lb

## 2013-07-19 DIAGNOSIS — K439 Ventral hernia without obstruction or gangrene: Secondary | ICD-10-CM

## 2013-07-19 NOTE — Patient Instructions (Signed)
No heavy lifting for another 2-3 weeks

## 2013-07-19 NOTE — Progress Notes (Signed)
Subjective:     Patient ID: [name], female   DOB: 1955/08/28, 58 y.o.   MRN: [id number]  HPI The patient is a 58 year old black female who is about 3 weeks status post laparoscopic ventral hernia repair with mesh. Her only complaint is of some discomfort in the right upper quadrant. Otherwise she feels much better than right after surgery. Her appetite is good and her bowels are working normally. She feels as though she is moving around comfortably.  Review of Systems     Objective:   Physical Exam On exam her abdomen is soft and nontender. Her incisions are all healing nicely with no sign of infection. I believe her soreness is at a site of one of the anchoring stitches    Assessment:     The patient is 3 weeks status post laparoscopic ventral hernia repair with mesh     Plan:     At this point I would like her to refrain from any heavy lifting for about 2-3 more weeks. I will plan to see her back in one month to check her progress.

## 2013-08-18 ENCOUNTER — Ambulatory Visit (INDEPENDENT_AMBULATORY_CARE_PROVIDER_SITE_OTHER): Payer: BC Managed Care – PPO | Admitting: General Surgery

## 2013-08-18 ENCOUNTER — Encounter (INDEPENDENT_AMBULATORY_CARE_PROVIDER_SITE_OTHER): Payer: Self-pay | Admitting: General Surgery

## 2013-08-18 VITALS — BP 150/90 | HR 100 | Temp 98.3°F | Resp 16 | Ht 66.0 in | Wt 284.4 lb

## 2013-08-18 DIAGNOSIS — K439 Ventral hernia without obstruction or gangrene: Secondary | ICD-10-CM

## 2013-08-18 NOTE — Patient Instructions (Signed)
May return to normal activities 

## 2013-08-18 NOTE — Progress Notes (Signed)
Subjective:     Patient ID: [name], female   DOB: Jan 10, 1956, 58 y.o.   MRN: [id number]  HPI The patient is a 58 year old black female who is about 2 months status post laparoscopic ventral hernia repair with mesh. She has tolerated the surgery well. She denies any abdominal pain. Her appetite is good no bowels are working normally.  Review of Systems     Objective:   Physical Exam On exam her abdomen is soft and nontender. Her incisions are all healing nicely with no sign of infection. Her abdominal wall feels very solid with no palpable evidence of recurrence of the hernia    Assessment:     The patient is 2 months status post laparoscopic ventral hernia repair with mesh     Plan:     At this point she may return to her normal activities without restriction. I will plan to see her back on a when necessary basis.

## 2014-04-21 ENCOUNTER — Other Ambulatory Visit: Payer: Self-pay

## 2016-05-05 ENCOUNTER — Other Ambulatory Visit: Payer: Self-pay | Admitting: Obstetrics and Gynecology

## 2016-05-05 DIAGNOSIS — N63 Unspecified lump in unspecified breast: Secondary | ICD-10-CM

## 2016-05-19 ENCOUNTER — Ambulatory Visit
Admission: RE | Admit: 2016-05-19 | Discharge: 2016-05-19 | Disposition: A | Discharge status: Home / Self Care | Payer: BLUE CROSS/BLUE SHIELD | Source: Ambulatory Visit | Attending: Obstetrics and Gynecology | Admitting: Obstetrics and Gynecology

## 2016-05-19 DIAGNOSIS — N63 Unspecified lump in unspecified breast: Secondary | ICD-10-CM

## 2016-05-23 ENCOUNTER — Ambulatory Visit (INDEPENDENT_AMBULATORY_CARE_PROVIDER_SITE_OTHER): Payer: BLUE CROSS/BLUE SHIELD | Admitting: Obstetrics & Gynecology

## 2016-05-23 ENCOUNTER — Other Ambulatory Visit (HOSPITAL_COMMUNITY)
Admission: RE | Admit: 2016-05-23 | Discharge: 2016-05-23 | Disposition: A | Discharge status: Home / Self Care | Payer: BLUE CROSS/BLUE SHIELD | Source: Ambulatory Visit | Attending: Obstetrics & Gynecology | Admitting: Obstetrics & Gynecology

## 2016-05-23 ENCOUNTER — Encounter: Payer: Self-pay | Admitting: Obstetrics & Gynecology

## 2016-05-23 VITALS — BP 160/88 | HR 80 | Ht 65.0 in | Wt 283.0 lb

## 2016-05-23 DIAGNOSIS — Z1211 Encounter for screening for malignant neoplasm of colon: Secondary | ICD-10-CM | POA: Diagnosis not present

## 2016-05-23 DIAGNOSIS — N9089 Other specified noninflammatory disorders of vulva and perineum: Secondary | ICD-10-CM

## 2016-05-23 DIAGNOSIS — Z01419 Encounter for gynecological examination (general) (routine) without abnormal findings: Secondary | ICD-10-CM | POA: Diagnosis present

## 2016-05-23 DIAGNOSIS — Z01411 Encounter for gynecological examination (general) (routine) with abnormal findings: Secondary | ICD-10-CM

## 2016-05-23 DIAGNOSIS — Z1151 Encounter for screening for human papillomavirus (HPV): Secondary | ICD-10-CM | POA: Insufficient documentation

## 2016-05-23 DIAGNOSIS — Z1212 Encounter for screening for malignant neoplasm of rectum: Secondary | ICD-10-CM

## 2016-05-23 LAB — HEMOCCULT GUIAC POC 1CARD (OFFICE): Fecal Occult Blood, POC: NEGATIVE

## 2016-05-23 NOTE — Addendum Note (Signed)
Addended by: Traci Sermon A on: 05/23/2016 10:05 AM   Modules accepted: Orders

## 2016-05-23 NOTE — Progress Notes (Signed)
Subjective:     [name] is a 60 y.o. female here for a routine exam.  No LMP recorded. Patient is postmenopausal. No obstetric history on file. Birth Control Method:  menopause Menstrual Calendar(currently): amenorrhiec  Current complaints: none.   Current acute medical issues:  none   Recent Gynecologic History No LMP recorded. Patient is postmenopausal. Last Pap: 2012,  normal Last mammogram: 2017,  normal  Past Medical History:  Diagnosis Date  . Arthritis   . Hyperlipidemia   . Hypertension     Past Surgical History:  Procedure Laterality Date  . CHOLECYSTECTOMY    . HERNIA REPAIR    . INSERTION OF MESH N/A 06/27/2013   Procedure: INSERTION OF MESH;  Surgeon: Merrie Roof, MD;  Location: [facility];  Service: General;  Laterality: N/A;  . TUBAL LIGATION    . VENTRAL HERNIA REPAIR N/A 06/27/2013   Procedure: LAPAROSCOPIC VENTRAL HERNIA;  Surgeon: Merrie Roof, MD;  Location: [facility];  Service: General;  Laterality: N/A;    OB History    No data available      Social History   Social History  . Marital status: Divorced    Spouse name: N/A  . Number of children: N/A  . Years of education: N/A   Social History Main Topics  . Smoking status: Current Every Day Smoker    Packs/day: 1.00    Types: Cigarettes  . Smokeless tobacco: Never Used  . Alcohol use Yes  . Drug use: No  . Sexual activity: Yes   Other Topics Concern  . None   Social History Narrative  . None    History reviewed. No pertinent family history.   Current Outpatient Prescriptions:  .  amLODipine-benazepril (LOTREL) 10-40 MG per capsule, Take 1 capsule by mouth daily., Disp: , Rfl:  .  losartan-hydrochlorothiazide (HYZAAR) 100-25 MG per tablet, Take 1 tablet by mouth daily., Disp: , Rfl:  .  meloxicam (MOBIC) 15 MG tablet, Take 15 mg by mouth daily., Disp: , Rfl:  .  naproxen sodium (ANAPROX) 220 MG tablet, Take 220 mg by mouth 2 (two) times daily with a meal., Disp: , Rfl:    Review of Systems  Review of Systems  Constitutional: Negative for fever, chills, weight loss, malaise/fatigue and diaphoresis.  HENT: Negative for hearing loss, ear pain, nosebleeds, congestion, sore throat, neck pain, tinnitus and ear discharge.   Eyes: Negative for blurred vision, double vision, photophobia, pain, discharge and redness.  Respiratory: Negative for cough, hemoptysis, sputum production, shortness of breath, wheezing and stridor.   Cardiovascular: Negative for chest pain, palpitations, orthopnea, claudication, leg swelling and PND.  Gastrointestinal: negative for abdominal pain. Negative for heartburn, nausea, vomiting, diarrhea, constipation, blood in stool and melena.  Genitourinary: Negative for dysuria, urgency, frequency, hematuria and flank pain.  Musculoskeletal: Negative for myalgias, back pain, joint pain and falls.  Skin: Negative for itching and rash.  Neurological: Negative for dizziness, tingling, tremors, sensory change, speech change, focal weakness, seizures, loss of consciousness, weakness and headaches.  Endo/Heme/Allergies: Negative for environmental allergies and polydipsia. Does not bruise/bleed easily.  Psychiatric/Behavioral: Negative for depression, suicidal ideas, hallucinations, memory loss and substance abuse. The patient is not nervous/anxious and does not have insomnia.        Objective:  Blood pressure (!) 160/88, pulse 80, height 5\' 5"  (1.651 m), weight 283 lb (128.4 kg).   Physical Exam  Vitals reviewed. Constitutional: She is oriented to person, place, and time. She appears  well-developed and well-nourished.  HENT:  Head: Normocephalic and atraumatic.        Right Ear: External ear normal.  Left Ear: External ear normal.  Nose: Nose normal.  Mouth/Throat: Oropharynx is clear and moist.  Eyes: Conjunctivae and EOM are normal. Pupils are equal, round, and reactive to light. Right eye exhibits no discharge. Left eye exhibits no discharge.  No scleral icterus.  Neck: Normal range of motion. Neck supple. No tracheal deviation present. No thyromegaly present.  Cardiovascular: Normal rate, regular rhythm, normal heart sounds and intact distal pulses.  Exam reveals no gallop and no friction rub.   No murmur heard. Respiratory: Effort normal and breath sounds normal. No respiratory distress. She has no wheezes. She has no rales. She exhibits no tenderness.  GI: Soft. Bowel sounds are normal. She exhibits no distension and no mass. There is no tenderness. There is no rebound and no guarding.  Genitourinary:  Breasts no masses skin changes or nipple changes bilaterally      Vulva has 2 warts and a verrucous lesion that is also probably warty, will remove in about 2 weeks Vagina is pink moist without discharge Cervix normal in appearance and pap is done Uterus is normal size shape and contour Adnexa is negative with normal sized ovaries  {Rectal    hemoccult negative, normal tone, no masses  Musculoskeletal: Normal range of motion. She exhibits no edema and no tenderness.  Neurological: She is alert and oriented to person, place, and time. She has normal reflexes. She displays normal reflexes. No cranial nerve deficit. She exhibits normal muscle tone. Coordination normal.  Skin: Skin is warm and dry. No rash noted. No erythema. No pallor.  Psychiatric: She has a normal mood and affect. Her behavior is normal. Judgment and thought content normal.       Medications Ordered at today's visit: Meds ordered this encounter  Medications  . naproxen sodium (ANAPROX) 220 MG tablet    Sig: Take 220 mg by mouth 2 (two) times daily with a meal.    Other orders placed at today's visit: No orders of the defined types were placed in this encounter.     Assessment:    Healthy female exam.   3 right vulvar lesions Plan:    removal of 3 right vulvar lesions     Return in about 12 days (around 06/04/2016) for @1  pm for removal of 3 right  vulvar lesions, with Dr Elonda Husky.

## 2016-05-27 LAB — CYTOLOGY - PAP
Diagnosis: NEGATIVE
HPV (WINDOPATH): NOT DETECTED

## 2016-06-04 ENCOUNTER — Ambulatory Visit (INDEPENDENT_AMBULATORY_CARE_PROVIDER_SITE_OTHER): Payer: BLUE CROSS/BLUE SHIELD | Admitting: Obstetrics & Gynecology

## 2016-06-04 ENCOUNTER — Other Ambulatory Visit: Payer: Self-pay | Admitting: Obstetrics & Gynecology

## 2016-06-04 ENCOUNTER — Encounter: Payer: Self-pay | Admitting: Obstetrics & Gynecology

## 2016-06-04 VITALS — BP 160/100 | HR 80 | Ht 65.0 in | Wt 281.0 lb

## 2016-06-04 DIAGNOSIS — Q825 Congenital non-neoplastic nevus: Secondary | ICD-10-CM

## 2016-06-04 DIAGNOSIS — A630 Anogenital (venereal) warts: Secondary | ICD-10-CM

## 2016-06-04 DIAGNOSIS — D229 Melanocytic nevi, unspecified: Secondary | ICD-10-CM

## 2016-06-04 NOTE — Addendum Note (Signed)
Addended by: Diona Fanti A on: 06/04/2016 04:14 PM   Modules accepted: Orders

## 2016-06-04 NOTE — Progress Notes (Signed)
PROCEDURE NOTE  PRE-OP DIAGNOSIS:  Verrucous lesion of right vulva                                        Warty lesions of the perineum and perirectal area  PROCEDURE:  Skin Lesion Excision(s)  INDICATIONS:  [name] is a 60 y.o. female who presents for minor skin surgery.  The patient understands all risks, benefits, indications, potential complications, and alternatives, and freely consents for the procedure.  The patient also understands the option of performing no surgery, the risk for scarring, and the technique of the procedure.  ANESTHESIA:  Local.  TECHNIQUE:  After informed consent was obtained, and after the skin was prepped and draped, 1% lidocaine without epinephrine for anesthetic was injected around and underneath the site.   elliptical excision in total x 3 was performed. 2 layer closure was required of all 3 lesions. A dressing was applied and wound care instructions were provided.  [name] tolerated the procedure well and without complications.  The patient will be alert for any signs of cutaneous infection and will follow up as instructed.

## 2016-06-13 ENCOUNTER — Ambulatory Visit (INDEPENDENT_AMBULATORY_CARE_PROVIDER_SITE_OTHER): Payer: BLUE CROSS/BLUE SHIELD | Admitting: Obstetrics & Gynecology

## 2016-06-13 ENCOUNTER — Encounter: Payer: Self-pay | Admitting: Obstetrics & Gynecology

## 2016-06-13 VITALS — BP 160/100 | HR 76 | Wt 280.0 lb

## 2016-06-13 DIAGNOSIS — Z4802 Encounter for removal of sutures: Secondary | ICD-10-CM

## 2016-06-13 NOTE — Progress Notes (Signed)
Blood pressure (!) 160/100, pulse 76, weight 280 lb (127 kg).  All the incisions look good healing well  Sutures tremoved  Pathology benign  Follow up prn

## 2016-07-17 ENCOUNTER — Encounter: Payer: Self-pay | Admitting: Orthopedic Surgery

## 2017-04-28 ENCOUNTER — Other Ambulatory Visit: Payer: Self-pay | Admitting: Family Medicine

## 2017-04-28 DIAGNOSIS — Z1231 Encounter for screening mammogram for malignant neoplasm of breast: Secondary | ICD-10-CM

## 2017-05-22 ENCOUNTER — Ambulatory Visit: Payer: BLUE CROSS/BLUE SHIELD

## 2017-06-19 ENCOUNTER — Ambulatory Visit
Admission: RE | Admit: 2017-06-19 | Discharge: 2017-06-19 | Disposition: A | Discharge status: Home / Self Care | Payer: BLUE CROSS/BLUE SHIELD | Source: Ambulatory Visit | Attending: Family Medicine | Admitting: Family Medicine

## 2017-06-19 DIAGNOSIS — Z1231 Encounter for screening mammogram for malignant neoplasm of breast: Secondary | ICD-10-CM | POA: Diagnosis not present

## 2018-10-18 DIAGNOSIS — E7849 Other hyperlipidemia: Secondary | ICD-10-CM | POA: Diagnosis not present

## 2018-10-18 DIAGNOSIS — Z6834 Body mass index (BMI) 34.0-34.9, adult: Secondary | ICD-10-CM | POA: Diagnosis not present

## 2018-10-18 DIAGNOSIS — Z23 Encounter for immunization: Secondary | ICD-10-CM | POA: Diagnosis not present

## 2018-10-18 DIAGNOSIS — Z1389 Encounter for screening for other disorder: Secondary | ICD-10-CM | POA: Diagnosis not present

## 2018-10-18 DIAGNOSIS — I10 Essential (primary) hypertension: Secondary | ICD-10-CM | POA: Diagnosis not present

## 2018-10-18 DIAGNOSIS — Z719 Counseling, unspecified: Secondary | ICD-10-CM | POA: Diagnosis not present

## 2018-10-18 DIAGNOSIS — R7309 Other abnormal glucose: Secondary | ICD-10-CM | POA: Diagnosis not present

## 2018-12-12 ENCOUNTER — Emergency Department (HOSPITAL_COMMUNITY)
Admission: EM | Admit: 2018-12-12 | Discharge: 2018-12-12 | Disposition: A | Discharge status: Home / Self Care | Payer: BC Managed Care – PPO | Attending: Emergency Medicine | Admitting: Emergency Medicine

## 2018-12-12 ENCOUNTER — Encounter (HOSPITAL_COMMUNITY): Payer: Self-pay | Admitting: Emergency Medicine

## 2018-12-12 ENCOUNTER — Emergency Department (HOSPITAL_COMMUNITY): Payer: BC Managed Care – PPO

## 2018-12-12 ENCOUNTER — Other Ambulatory Visit: Payer: Self-pay

## 2018-12-12 DIAGNOSIS — M19041 Primary osteoarthritis, right hand: Secondary | ICD-10-CM | POA: Diagnosis not present

## 2018-12-12 DIAGNOSIS — S8001XA Contusion of right knee, initial encounter: Secondary | ICD-10-CM | POA: Diagnosis not present

## 2018-12-12 DIAGNOSIS — I10 Essential (primary) hypertension: Secondary | ICD-10-CM | POA: Diagnosis not present

## 2018-12-12 DIAGNOSIS — Y939 Activity, unspecified: Secondary | ICD-10-CM | POA: Insufficient documentation

## 2018-12-12 DIAGNOSIS — Z79899 Other long term (current) drug therapy: Secondary | ICD-10-CM | POA: Diagnosis not present

## 2018-12-12 DIAGNOSIS — M79641 Pain in right hand: Secondary | ICD-10-CM | POA: Diagnosis not present

## 2018-12-12 DIAGNOSIS — Y999 Unspecified external cause status: Secondary | ICD-10-CM | POA: Insufficient documentation

## 2018-12-12 DIAGNOSIS — T1490XA Injury, unspecified, initial encounter: Secondary | ICD-10-CM

## 2018-12-12 DIAGNOSIS — S50311A Abrasion of right elbow, initial encounter: Secondary | ICD-10-CM | POA: Insufficient documentation

## 2018-12-12 DIAGNOSIS — M1712 Unilateral primary osteoarthritis, left knee: Secondary | ICD-10-CM | POA: Diagnosis not present

## 2018-12-12 DIAGNOSIS — R52 Pain, unspecified: Secondary | ICD-10-CM | POA: Diagnosis not present

## 2018-12-12 DIAGNOSIS — F17210 Nicotine dependence, cigarettes, uncomplicated: Secondary | ICD-10-CM | POA: Insufficient documentation

## 2018-12-12 DIAGNOSIS — M25561 Pain in right knee: Secondary | ICD-10-CM | POA: Diagnosis not present

## 2018-12-12 DIAGNOSIS — S60512A Abrasion of left hand, initial encounter: Secondary | ICD-10-CM

## 2018-12-12 DIAGNOSIS — S60511A Abrasion of right hand, initial encounter: Secondary | ICD-10-CM | POA: Insufficient documentation

## 2018-12-12 DIAGNOSIS — Y92481 Parking lot as the place of occurrence of the external cause: Secondary | ICD-10-CM | POA: Diagnosis not present

## 2018-12-12 DIAGNOSIS — S59901A Unspecified injury of right elbow, initial encounter: Secondary | ICD-10-CM | POA: Diagnosis not present

## 2018-12-12 DIAGNOSIS — S8002XA Contusion of left knee, initial encounter: Secondary | ICD-10-CM | POA: Insufficient documentation

## 2018-12-12 DIAGNOSIS — M79605 Pain in left leg: Secondary | ICD-10-CM | POA: Diagnosis not present

## 2018-12-12 DIAGNOSIS — R000 Tachycardia, unspecified: Secondary | ICD-10-CM | POA: Diagnosis not present

## 2018-12-12 DIAGNOSIS — M1812 Unilateral primary osteoarthritis of first carpometacarpal joint, left hand: Secondary | ICD-10-CM | POA: Diagnosis not present

## 2018-12-12 MED ORDER — IBUPROFEN 800 MG PO TABS
800.0000 mg | ORAL_TABLET | Freq: Once | ORAL | Status: AC
Start: 2018-12-12 — End: 2018-12-12
  Administered 2018-12-12: 800 mg via ORAL
  Filled 2018-12-12: qty 1

## 2018-12-12 MED ORDER — IBUPROFEN 800 MG PO TABS: 800.0000 mg | ORAL_TABLET | Freq: Three times a day (TID) | ORAL | 0 refills | Status: AC

## 2018-12-12 MED ORDER — BACITRACIN ZINC 500 UNIT/GM EX OINT
TOPICAL_OINTMENT | CUTANEOUS | Status: AC
  Administered 2018-12-12: 2
  Filled 2018-12-12: qty 1.8
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
## 2018-12-12 NOTE — ED Notes (Signed)
To Rad 

## 2018-12-12 NOTE — ED Notes (Signed)
abrasion to R elbow  Moves both arms ad lib  No jewelry  To hands  Difficulty moving from wheelchair to stretcher due to pain in her L knee Pain is lateral knee area

## 2018-12-12 NOTE — ED Notes (Signed)
KS in to assess 

## 2018-12-12 NOTE — Discharge Instructions (Signed)
Return if any problems.
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
## 2018-12-15 DIAGNOSIS — Z6834 Body mass index (BMI) 34.0-34.9, adult: Secondary | ICD-10-CM | POA: Diagnosis not present

## 2018-12-15 DIAGNOSIS — M1711 Unilateral primary osteoarthritis, right knee: Secondary | ICD-10-CM | POA: Diagnosis not present

## 2018-12-15 DIAGNOSIS — M2550 Pain in unspecified joint: Secondary | ICD-10-CM | POA: Diagnosis not present

## 2018-12-15 DIAGNOSIS — M1712 Unilateral primary osteoarthritis, left knee: Secondary | ICD-10-CM | POA: Diagnosis not present

## 2018-12-16 DIAGNOSIS — R7309 Other abnormal glucose: Secondary | ICD-10-CM | POA: Diagnosis not present
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
## 2020-04-16 ENCOUNTER — Emergency Department (HOSPITAL_COMMUNITY): Payer: 59

## 2020-04-16 ENCOUNTER — Encounter (HOSPITAL_COMMUNITY): Payer: Self-pay | Admitting: Emergency Medicine

## 2020-04-16 ENCOUNTER — Emergency Department (HOSPITAL_COMMUNITY)
Admission: EM | Admit: 2020-04-16 | Discharge: 2020-05-07 | Disposition: E | Payer: 59 | Attending: Emergency Medicine | Admitting: Emergency Medicine

## 2020-04-16 DIAGNOSIS — F17210 Nicotine dependence, cigarettes, uncomplicated: Secondary | ICD-10-CM | POA: Diagnosis not present

## 2020-04-16 DIAGNOSIS — I10 Essential (primary) hypertension: Secondary | ICD-10-CM | POA: Insufficient documentation

## 2020-04-16 DIAGNOSIS — I469 Cardiac arrest, cause unspecified: Secondary | ICD-10-CM | POA: Diagnosis present

## 2020-04-16 LAB — CBC
HCT: 44.6 % (ref 36.0–46.0)
Hemoglobin: 13.6 g/dL (ref 12.0–15.0)
MCH: 35.2 pg — ABNORMAL HIGH (ref 26.0–34.0)
MCHC: 30.5 g/dL (ref 30.0–36.0)
MCV: 115.5 fL — ABNORMAL HIGH (ref 80.0–100.0)
Platelets: UNDETERMINED 10*3/uL (ref 150–400)
RBC: 3.86 MIL/uL — ABNORMAL LOW (ref 3.87–5.11)
RDW: 15.9 % — ABNORMAL HIGH (ref 11.5–15.5)
WBC: 20.1 10*3/uL — ABNORMAL HIGH (ref 4.0–10.5)
nRBC: 2 % — ABNORMAL HIGH (ref 0.0–0.2)

## 2020-04-16 LAB — CBG MONITORING, ED: Glucose-Capillary: 159 mg/dL — ABNORMAL HIGH (ref 70–99)

## 2020-04-16 MED ORDER — SODIUM BICARBONATE 8.4 % IV SOLN
INTRAVENOUS | Status: AC | PRN
  Administered 2020-04-16: 50 meq via INTRAVENOUS

## 2020-04-16 MED ORDER — SODIUM CHLORIDE 0.9% FLUSH: 3.0000 mL | Freq: Once | INTRAVENOUS | Status: DC

## 2020-04-16 MED ORDER — EPINEPHRINE HCL 5 MG/250ML IV SOLN IN NS
INTRAVENOUS | Status: AC
  Administered 2020-04-16: 03:00:00 10 ug/min
  Filled 2020-04-16: qty 250

## 2020-04-16 MED ORDER — EPINEPHRINE 1 MG/10ML IJ SOSY
PREFILLED_SYRINGE | INTRAMUSCULAR | Status: AC | PRN
  Administered 2020-04-16 (×3): 1 via INTRAVENOUS

## 2020-04-16 MED ORDER — SODIUM CHLORIDE 0.9 % IV SOLN
INTRAVENOUS | Status: AC | PRN
  Administered 2020-04-16: 1000 mL via INTRAVENOUS

## 2020-04-16 MED FILL — Medication: Qty: 1 | Status: AC

## 2020-05-07 NOTE — ED Notes (Signed)
physician attempting to contact family

## 2020-05-07 NOTE — ED Provider Notes (Signed)
  [facility] Provider Note      Procedures Procedure Name: Intubation Date/Time: May 06, 2020 2:46 AM Performed by: Montine Circle, PA-C Pre-anesthesia Checklist: Patient identified, Timeout performed, Emergency Drugs available, Suction available and Patient being monitored Oxygen Delivery Method: Ambu bag Preoxygenation: Pre-oxygenation with 100% oxygen Ventilation: Mask ventilation with difficulty Laryngoscope Size: Glidescope and 3 Tube size: 7.5 mm Number of attempts: 1 Airway Equipment and Method: Video-laryngoscopy Placement Confirmation: ETT inserted through vocal cords under direct vision,  Breath sounds checked- equal and bilateral,  Positive ETCO2 and CO2 detector Secured at: 23 cm Tube secured with: ETT holder Dental Injury: Teeth and Oropharynx as per pre-operative assessment  Difficulty Due To: Difficulty was unanticipated          Montine Circle, PA-C 06-May-2020 [place], April, MD 06-May-2020 [id number]

## 2020-05-07 NOTE — Code Documentation (Signed)
Pt intubated
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
## 2020-05-07 NOTE — Code Documentation (Signed)
Epi drinp started@ 10

## 2020-05-07 NOTE — ED Provider Notes (Signed)
[facility] EMERGENCY DEPARTMENT Provider Note   CSN: [id number] Arrival date & time: 19-Apr-2020  0216     History Chief Complaint  Patient presents with  . CPR    [name] is a 64 y.o. female.  The history is provided by the EMS personnel. The history is limited by the condition of the patient.  Cardiac Arrest Witnessed by:  Not witnessed Incident location:  Home Time since incident:  80 minutes Time before BLS initiated:  3-5 minutes Time before ALS initiated:  3-5 minutes Condition upon EMS arrival:  Apneic Pulse:  Absent Initial cardiac rhythm per EMS:  Asystole Treatments prior to arrival:  ACLS protocol Medications given prior to ED:  Epinephrine Airway: king airway  Rhythm on admission to ED:  PEA Associated symptoms: loss of consciousness   Risk factors comment:  [facility] called out for "heart problems".  EMS arrived and patient did not answer the door.  Broke down the door and patient was unresponsive and in asystole.  Regained pulses reportedly and was in AFIB and then lost pulses again en route.  7 epis given PTA     Past Medical History:  Diagnosis Date  . Arthritis   . Hyperlipidemia   . Hypertension     Patient Active Problem List   Diagnosis Date Noted  . Ventral hernia 05/31/2013    Past Surgical History:  Procedure Laterality Date  . CHOLECYSTECTOMY    . HERNIA REPAIR    . INSERTION OF MESH N/A 06/27/2013   Procedure: INSERTION OF MESH;  Surgeon: Merrie Roof, MD;  Location: [place];  Service: General;  Laterality: N/A;  . TUBAL LIGATION    . VENTRAL HERNIA REPAIR N/A 06/27/2013   Procedure: LAPAROSCOPIC VENTRAL HERNIA;  Surgeon: Merrie Roof, MD;  Location: [facility];  Service: General;  Laterality: N/A;     OB History   No obstetric history on file.     History reviewed. No pertinent family history.  Social History   Tobacco Use  . Smoking status: Current Every Day Smoker    Packs/day: 1.00    Types:  Cigarettes  . Smokeless tobacco: Never Used  Substance Use Topics  . Alcohol use: Yes  . Drug use: No    Home Medications Prior to Admission medications   Medication Sig Start Date End Date Taking? Authorizing Provider  amLODipine-benazepril (LOTREL) 10-40 MG per capsule Take 1 capsule by mouth daily.    [provider]  ibuprofen (ADVIL) 800 MG tablet Take 1 tablet (800 mg total) by mouth 3 (three) times daily. 12/12/18   Fransico Meadow, PA-C  losartan-hydrochlorothiazide (HYZAAR) 100-25 MG per tablet Take 1 tablet by mouth daily.    [provider]  meloxicam (MOBIC) 15 MG tablet Take 15 mg by mouth daily.    [provider]  naproxen sodium (ANAPROX) 220 MG tablet Take 220 mg by mouth 2 (two) times daily with a meal.    [provider]    Allergies    Patient has no known allergies.  Review of Systems   Review of Systems  Unable to perform ROS: Acuity of condition  Skin: Positive for rash.  Neurological: Positive for loss of consciousness.    Physical Exam Updated Vital Signs Resp (!) 0   SpO2 91%   Physical Exam Constitutional:      Appearance: She is well-developed.  HENT:     Head: Normocephalic and atraumatic.  Nose: Nose normal.     Mouth/Throat:     Mouth: Mucous membranes are moist.     Pharynx: Oropharynx is clear.  Eyes:     Comments: Fixed and dilated   Cardiovascular:     Comments: Absent  Pulmonary:     Comments: Rhonchi with bagging  Abdominal:     General: There is no distension.  Musculoskeletal:        General: No swelling or deformity.  Lymphadenopathy:     Cervical: No cervical adenopathy.  Skin:    General: Skin is dry.     Capillary Refill: Capillary refill takes more than 3 seconds.     Coloration: Skin is not pale.     Findings: No erythema.  Neurological:     GCS: GCS eye subscore is 1. GCS verbal subscore is 1. GCS motor subscore is 1.  Psychiatric:     Comments: Unable      ED Results /  Procedures / Treatments   Labs (all labs ordered are listed, but only abnormal results are displayed) Results for orders placed or performed during the hospital encounter of 04-27-20  CBG monitoring, ED  Result Value Ref Range   Glucose-Capillary 159 (H) 70 - 99 mg/dL   No results found.  EKG None  Radiology No results found.  Procedures Procedures (including critical care time)  Medications Ordered in ED Medications  sodium chloride flush (NS) 0.9 % injection 3 mL (has no administration in time range)  EPINEPHrine (ADRENALIN) 1 MG/10ML injection (1 Syringe Intravenous Given 2020-04-27 0233)  sodium bicarbonate injection (50 mEq Intravenous Given 2020/04/27 0222)  0.9 %  sodium chloride infusion ( Intravenous Stopped 2020-04-27 0246)  EPINEPHrine NaCl 4-0.9 MG/250ML-% premix infusion (10 mcg/min  Given 2020-04-27 5638)    ED Course  I have reviewed the triage vital signs and the nursing notes.  Pertinent labs & imaging results that were available during my care of the patient were reviewed by me and considered in my medical decision making (see chart for details).    [facility]  Department of Emergency Medicine    Scheduled Meds: . sodium chloride flush  3 mL Intravenous Once   Continuous Infusions: PRN Meds:. Past Medical History:  Diagnosis Date  . Arthritis   . Hyperlipidemia   . Hypertension    Past Surgical History:  Procedure Laterality Date  . CHOLECYSTECTOMY    . HERNIA REPAIR    . INSERTION OF MESH N/A 06/27/2013   Procedure: INSERTION OF MESH;  Surgeon: Merrie Roof, MD;  Location: [place];  Service: General;  Laterality: N/A;  . TUBAL LIGATION    . VENTRAL HERNIA REPAIR N/A 06/27/2013   Procedure: LAPAROSCOPIC VENTRAL HERNIA;  Surgeon: Merrie Roof, MD;  Location: [facility];  Service: General;  Laterality: N/A;   Social History   Socioeconomic History  . Marital status: Divorced    Spouse name: Not on file  . Number of children: Not  on file  . Years of education: Not on file  . Highest education level: Not on file  Occupational History  . Not on file  Tobacco Use  . Smoking status: Current Every Day Smoker    Packs/day: 1.00    Types: Cigarettes  . Smokeless tobacco: Never Used  Substance and Sexual Activity  . Alcohol use: Yes  . Drug use: No  . Sexual activity: Yes  Other Topics Concern  . Not on file  Social History Narrative  .  Not on file   Social Determinants of Health   Financial Resource Strain:   . Difficulty of Paying Living Expenses: Not on file  Food Insecurity:   . Worried About Charity fundraiser in the Last Year: Not on file  . Ran Out of Food in the Last Year: Not on file  Transportation Needs:   . Lack of Transportation (Medical): Not on file  . Lack of Transportation (Non-Medical): Not on file  Physical Activity:   . Days of Exercise per Week: Not on file  . Minutes of Exercise per Session: Not on file  Stress:   . Feeling of Stress : Not on file  Social Connections:   . Frequency of Communication with Friends and Family: Not on file  . Frequency of Social Gatherings with Friends and Family: Not on file  . Attends Religious Services: Not on file  . Active Member of Clubs or Organizations: Not on file  . Attends Archivist Meetings: Not on file  . Marital Status: Not on file  Intimate Partner Violence:   . Fear of Current or Ex-Partner: Not on file  . Emotionally Abused: Not on file  . Physically Abused: Not on file  . Sexually Abused: Not on file   No Known Allergies  Last set of Vital Signs (not current) Vitals:   April 26, 2020 0253 2020-04-26 0254  Resp: (!) 0 (!) 0  SpO2:              Cardiopulmonary Resuscitation (CPR) Procedure Note Directed/Performed by: Gloristine Turrubiates K Marni Franzoni-Rasch I personally directed ancillary staff and/or performed CPR in an effort to regain return of spontaneous circulation and to maintain cardiac, neuro and systemic perfusion.     Patient was in PEA, continuous high quality CPR.  EPI given, patient intubated.  Pulse restored in brady rhytmn and then lost CPR resumed, EPI given.  EPI drip initiated but lost pulses.    TOD 244 am   Case d/w Dr. Izora Ribas who will accept the patient.      Final Clinical Impression(s) / ED Diagnoses Final diagnoses:  Cardiac arrest [facility])    Patient expired.    Attempted contact of family    Herberto Ledwell, MD 26-Apr-2020 [id number]

## 2020-05-07 DEATH — deceased
